# Patient Record
Sex: Female | Born: 1935 | Race: Black or African American | Hispanic: No | Smoking: Former smoker
Health system: Southern US, Community
[De-identification: ages and names within clinical notes are randomized; demographics above are authoritative.]

## PROBLEM LIST (undated history)

## (undated) DIAGNOSIS — F419 Anxiety disorder, unspecified: Secondary | ICD-10-CM

## (undated) DIAGNOSIS — C801 Malignant (primary) neoplasm, unspecified: Secondary | ICD-10-CM

## (undated) DIAGNOSIS — E7800 Pure hypercholesterolemia, unspecified: Secondary | ICD-10-CM

## (undated) DIAGNOSIS — I10 Essential (primary) hypertension: Secondary | ICD-10-CM

## (undated) DIAGNOSIS — K219 Gastro-esophageal reflux disease without esophagitis: Secondary | ICD-10-CM

## (undated) HISTORY — PX: ABDOMINAL HYSTERECTOMY: SHX81

## (undated) HISTORY — PX: OTHER SURGICAL HISTORY: SHX169

---

## 2000-08-25 ENCOUNTER — Emergency Department (HOSPITAL_COMMUNITY): Admission: EM | Admit: 2000-08-25 | Discharge: 2000-08-25 | Payer: Self-pay | Admitting: *Deleted

## 2000-08-25 ENCOUNTER — Encounter: Payer: Self-pay | Admitting: *Deleted

## 2000-09-24 ENCOUNTER — Encounter (HOSPITAL_COMMUNITY): Admission: RE | Admit: 2000-09-24 | Discharge: 2000-10-24 | Payer: Self-pay | Admitting: Internal Medicine

## 2005-04-16 ENCOUNTER — Emergency Department (HOSPITAL_COMMUNITY): Admission: EM | Admit: 2005-04-16 | Discharge: 2005-04-16 | Payer: Self-pay | Admitting: Emergency Medicine

## 2006-03-18 ENCOUNTER — Emergency Department (HOSPITAL_COMMUNITY): Admission: EM | Admit: 2006-03-18 | Discharge: 2006-03-18 | Payer: Self-pay | Admitting: Emergency Medicine

## 2010-03-13 ENCOUNTER — Ambulatory Visit: Payer: Self-pay | Admitting: Orthopedic Surgery

## 2010-03-13 DIAGNOSIS — M7580 Other shoulder lesions, unspecified shoulder: Secondary | ICD-10-CM

## 2010-06-11 NOTE — Letter (Signed)
Summary: History Form  History Form   Imported By: Jacklynn Ganong 03/20/2010 16:11:42  _____________________________________________________________________  External Attachment:    Type:   Image     Comment:   External Document

## 2010-06-11 NOTE — Letter (Signed)
Summary: *Consult Note  [facility]  [address]  [place], [place] [address]   Phone: [phone]  Fax: [phone]    Re:    Kiara Martinez DOB:    [birth year]   Dear: Earvin Hansen   Thank you for requesting that we see the above patient for consultation.  A copy of the detailed office note will be sent under separate cover, for your review.  Evaluation today is consistent with:  1)  IMPINGEMENT SYNDROME (ICD-726.2) 2)  HX, FAMILY, ASTHMA (ICD-V17.5) 3)  FAMILY HISTORY OF ARTHRITIS (ICD-V17.7) 4)  FAMILY HISTORY OF DIABETES (ICD-V18.0)   Our recommendation is for: subacromial injection   New Orders include:  1)  New Patient Level III [99203] 2)  Joint Aspirate / Injection, Large [20610] 3)  Depo- Medrol 40mg  [J1030]   New Medications started today include: none   After today's visit, the patients current medications include:the same   Thank you for this consultation.  If you have any further questions regarding the care of this patient, please do not hesitate to contact me @ [phone]  Thank you for this opportunity to look after your patient.  Sincerely,   Fuller Canada MD

## 2010-06-11 NOTE — Assessment & Plan Note (Signed)
Summary: EVAL/TREAT RT SHOULDER PAIN,RADIATES DOWNWARD/STATES DOES NOT...   Vital Signs:  Patient profile:   77 year old female Height:      56 inches Weight:      206 pounds Pulse rate:   70 / minute Resp:     16 per minute  Vitals Entered By: Fuller Canada MD (March 13, 2010 2:00 PM)  Visit Type:  new patient Referring Provider:  Dr. Loleta Chance Primary Provider:  Dr. Mirna Mires  CC:  right shoulder pain.  History of Present Illness: I saw Kiara Martinez in the office today for an initial visit.  She is a 77 years old woman with the complaint of:  right shoulder pain.  No injury.  Tylenol 3 and Voltaren gel for shoulder pain, Insulin, cholesterol med and BP med.  Xrays today in our office.  This is a 77 year old female presents with pain in her RIGHT shoulder radiating towards her neck and down into her arm about above the elbow.  No associated trauma.  Complains of increased pain at night, not as bad during the day  Her pain is 7/10 she described as throbbing it came on gradually she denies any loss of motion      Allergies (verified): No Known Drug Allergies  Past History:  Past Medical History: diabetic htn  Past Surgical History: cancer ovaries  Family History: Family History of Diabetes Family History of Arthritis Hx, family, asthma  Social History: Patient is divorced.  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 Cardiovascular:  Denies chest pain, palpitations, fainting, and murmurs. Respiratory:  Denies short of breath, wheezing, couch, tightness, pain on inspiration, and snoring . Gastrointestinal:  Complains of heartburn; denies nausea, vomiting, diarrhea, constipation, and blood in your stools. Genitourinary:  Denies frequency, urgency, difficulty urinating, painful urination, flank pain, and bleeding in  urine. Neurologic:  Denies numbness, tingling, unsteady gait, dizziness, tremors, and seizure. Musculoskeletal:  Denies joint pain, swelling, instability, stiffness, redness, heat, and muscle pain. Endocrine:  Denies excessive thirst, exessive urination, and heat or cold intolerance. Psychiatric:  Denies nervousness, depression, anxiety, and hallucinations. Skin:  Denies changes in the skin, poor healing, rash, itching, and redness. HEENT:  Complains of watering; denies blurred or double vision, eye pain, and redness. Immunology:  Complains of seasonal allergies; denies sinus problems and allergic to bee stings. Hemoatologic:  Denies easy bleeding and brusing.  Physical Exam  Skin:  intact without lesions or rashes, multiple pigmentations but no evidence of mild Cervical Nodes:  no significant adenopathy Psych:  alert and cooperative; normal mood and affect; normal attention span and concentration   Shoulder/Elbow Exam  General:    Well-developed, well-nourished, Large body habitus; no deformities, normal grooming.    Inspection:    Inspection is normal.    Palpation:    tenderness R-AC:, tenderness R-deltoid:, and tenderness R-biciptal tendon:.    Vascular:    Radial, ulnar, brachial, and axillary pulses 2+ and symmetric; capillary refill less than 2 seconds; no evidence of ischemia, clubbing, or cyanosis.    Sensory:    Gross sensation intact in the upper extremities.    Motor:    Normal strength in the upper extremities.    Reflexes:    Normal reflexes in the upper extremities.    Shoulder Exam:    Right:    Inspection:  Normal    Palpation:  Normal  Stability:  stable    Tenderness:  no    Swelling:  no    Erythema:  no    Range of Motion:       Flexion-Active: 180       Extension-Active: 45       Flexion-Passive: 180       Extension-Passive: 45       External Rotation : 45       Interior Rotation : T7    Left:    Inspection:  Normal    Palpation:   Normal    Stability:  stable    Tenderness:  no    Swelling:  no    Erythema:  no    Range of Motion:       Flexion-Active: 180       Extension-Active: 45       Flexion-Passive: 180       Extension-Passive: 45       External Rotation : 45       Interior Rotation : T7  Impingement Sign NEER:    Right positive; Left negative Impingement Sign HAWKINS:    Right negative; Left negative Apprehension Sign:    Right negative; Left negative AC joint Adduction Test:    Right negative; Left negative   Impression & Recommendations:  Problem # 1:  IMPINGEMENT SYNDROME (ICD-726.2) Assessment New  she absolutely refuses an x-ray but we did diagnose her clinically with subacromial impingement and gave her an injection.  Orders: New Patient Level III (06269) Joint Aspirate / Injection, Large (20610) Depo- Medrol 40mg  (J1030)  Patient Instructions: 1)  You have received an injection of cortisone today. You may experience increased pain at the injection site. Apply ice pack to the area for 20 minutes every 2 hours and take 2 xtra strength tylenol every 8 hours. This increased pain will usually resolve in 24 hours. The injection will take effect in 3-10 days.  2)  Please schedule a follow-up appointment as needed.   Orders Added: 1)  New Patient Level III [48546] 2)  Joint Aspirate / Injection, Large [20610] 3)  Depo- Medrol 40mg  [J1030]

## 2011-11-06 ENCOUNTER — Encounter (HOSPITAL_COMMUNITY): Payer: Self-pay | Admitting: Emergency Medicine

## 2011-11-06 ENCOUNTER — Emergency Department (HOSPITAL_COMMUNITY)
Admission: EM | Admit: 2011-11-06 | Discharge: 2011-11-06 | Disposition: A | Discharge status: Home / Self Care | Payer: Medicare Other | Attending: Emergency Medicine | Admitting: Emergency Medicine

## 2011-11-06 DIAGNOSIS — R42 Dizziness and giddiness: Secondary | ICD-10-CM | POA: Insufficient documentation

## 2011-11-06 HISTORY — DX: Essential (primary) hypertension: I10

## 2011-11-06 LAB — POCT I-STAT, CHEM 8
Creatinine, Ser: 0.9 mg/dL (ref 0.50–1.10)
Hemoglobin: 14.3 g/dL (ref 12.0–15.0)
Sodium: 139 mEq/L (ref 135–145)
TCO2: 24 mmol/L (ref 0–100)

## 2011-11-06 MED ORDER — MECLIZINE HCL 25 MG PO TABS: 25.0000 mg | ORAL_TABLET | Freq: Three times a day (TID) | ORAL | Status: AC | PRN

## 2011-11-06 MED ORDER — MECLIZINE HCL 12.5 MG PO TABS
25.0000 mg | ORAL_TABLET | Freq: Once | ORAL | Status: AC
  Administered 2011-11-06: 25 mg via ORAL
  Filled 2011-11-06: qty 2

## 2011-11-06 NOTE — ED Notes (Signed)
Pt ambulating fine. Denies dizziness at this time.

## 2011-11-06 NOTE — ED Notes (Addendum)
Reports that she got up this am and was very dizzy.Family here with patient.  Talking and does not appear to be in any distress.

## 2011-11-06 NOTE — ED Provider Notes (Signed)
History     CSN: [id number]  Arrival date & time      First MD Initiated Contact with Patient 11/06/11 0612      Chief Complaint  Patient presents with  . Dizziness    (Consider location/radiation/quality/duration/timing/severity/associated sxs/prior treatment) HPI  Kiara Martinez is a 78 y.o. female brought in by ambulance, who presents to the Emergency Department complaining of dizziness when she got out of bed this morning to go to the bathroom.She had been up twice earlier without dizziness.  Associated with mild nausea that resolved on its own. She took her blood pressure medicines. She denies fever, chills, nausea, vomiting, chest pain, shortness of breath.   PCP Dr. Loleta Chance No past medical history on file.  No past surgical history on file.  No family history on file.  History  Substance Use Topics  . Smoking status: Not on file  . Smokeless tobacco: Not on file  . Alcohol Use: Not on file    OB History    No data available      Review of Systems  Constitutional: Negative for fever.       10 Systems reviewed and are negative for acute change except as noted in the HPI.  HENT: Negative for congestion.   Eyes: Negative for discharge and redness.  Respiratory: Negative for cough and shortness of breath.   Cardiovascular: Negative for chest pain.  Gastrointestinal: Negative for vomiting and abdominal pain.  Musculoskeletal: Negative for back pain.  Skin: Negative for rash.  Neurological: Positive for dizziness. Negative for syncope, numbness and headaches.  Psychiatric/Behavioral:       No behavior change.    Allergies  Review of patient's allergies indicates not on file.  Home Medications  No current outpatient prescriptions on file.  There were no vitals taken for this visit.  Physical Exam  Nursing note and vitals reviewed. Constitutional: She is oriented to person, place, and time. She appears well-developed and well-nourished.       Awake, alert,  nontoxic appearance.  HENT:  Head: Atraumatic.  Eyes: Right eye exhibits no discharge. Left eye exhibits no discharge.  Neck: Neck supple.  Cardiovascular: Normal rate, normal heart sounds and intact distal pulses.   Pulmonary/Chest: Effort normal and breath sounds normal. She exhibits no tenderness.  Abdominal: Soft. Bowel sounds are normal. There is no tenderness. There is no rebound.  Musculoskeletal: She exhibits no edema and no tenderness.       Baseline ROM, no obvious new focal weakness.  Neurological: She is alert and oriented to person, place, and time. She has normal reflexes. No cranial nerve deficit.       Mental status and motor strength appears baseline for patient and situation.  Skin: No rash noted.  Psychiatric: She has a normal mood and affect.    ED Course  Procedures (including critical care time)    MDM  Patient with sudden onset of dizziness this morning that has improved since arrival in the ER. Orthostatics negative. Given antivert. Pt stable in ED with no significant deterioration in condition.The patient appears reasonably screened and/or stabilized for discharge and I doubt any other medical condition or other [facility] requiring further screening, evaluation, or treatment in the ED at this time prior to discharge.  MDM Reviewed: nursing note and vitals           Nicoletta Dress. Colon Branch, MD 11/06/11 4098

## 2011-11-06 NOTE — Discharge Instructions (Signed)
Make position changes slowly. Take the medicine as directed. Follow up with Dr. Loleta Chance.

## 2011-11-06 NOTE — ED Notes (Signed)
EMS called out - patient difficulty standing due to feeling dizzy.  BP 210/98 Pulse 65  02 sat 94% room air

## 2011-11-23 ENCOUNTER — Encounter (HOSPITAL_COMMUNITY): Payer: Self-pay

## 2011-11-23 ENCOUNTER — Emergency Department (HOSPITAL_COMMUNITY)
Admission: EM | Admit: 2011-11-23 | Discharge: 2011-11-23 | Disposition: A | Discharge status: Home / Self Care | Payer: Medicare Other | Attending: Emergency Medicine | Admitting: Emergency Medicine

## 2011-11-23 DIAGNOSIS — I10 Essential (primary) hypertension: Secondary | ICD-10-CM | POA: Insufficient documentation

## 2011-11-23 DIAGNOSIS — S30860A Insect bite (nonvenomous) of lower back and pelvis, initial encounter: Secondary | ICD-10-CM | POA: Insufficient documentation

## 2011-11-23 DIAGNOSIS — E119 Type 2 diabetes mellitus without complications: Secondary | ICD-10-CM | POA: Insufficient documentation

## 2011-11-23 DIAGNOSIS — W57XXXA Bitten or stung by nonvenomous insect and other nonvenomous arthropods, initial encounter: Secondary | ICD-10-CM | POA: Insufficient documentation

## 2011-11-23 MED ORDER — DOXYCYCLINE HYCLATE 100 MG PO TABS
100.0000 mg | ORAL_TABLET | Freq: Once | ORAL | Status: AC
  Administered 2011-11-23: 100 mg via ORAL
  Filled 2011-11-23: qty 1

## 2011-11-23 MED ORDER — DOXYCYCLINE HYCLATE 100 MG PO CAPS: 100.0000 mg | ORAL_CAPSULE | Freq: Two times a day (BID) | ORAL | Status: AC

## 2011-11-23 NOTE — ED Provider Notes (Signed)
History     CSN: [id number]  Arrival date & time 11/23/11  1357   First MD Initiated Contact with Patient 11/23/11 1427      Chief Complaint  Patient presents with  . Tick Removal    (Consider location/radiation/quality/duration/timing/severity/associated sxs/prior treatment) HPI Comments: Pt found imbedded tick engorged, on L flank area yest.  No fever or rash.  No joint pain.  The history is provided by the patient. No language interpreter was used.    Past Medical History  Diagnosis Date  . Diabetes mellitus   . Hypertension     History reviewed. No pertinent past surgical history.  No family history on file.  History  Substance Use Topics  . Smoking status: Never Smoker   . Smokeless tobacco: Not on file  . Alcohol Use: No    OB History    Grav Para Term Preterm Abortions TAB SAB Ect Mult Living                  Review of Systems  Constitutional: Negative for fever.  Musculoskeletal: Negative for myalgias, joint swelling and arthralgias.  Skin: Negative for rash.       Erythematous area  All other systems reviewed and are negative.    Allergies  Review of patient's allergies indicates no known allergies.  Home Medications   Current Outpatient Rx  Name Route Sig Dispense Refill  . AMLODIPINE BESY-BENAZEPRIL HCL 5-10 MG PO CAPS Oral Take 1 capsule by mouth daily.    . ASPIRIN 81 MG PO TABS Oral Take 81 mg by mouth daily.    . CHOLINE & MAG SALICYLATES 500 MG PO TABS Oral Take 500 mg by mouth 2 (two) times daily.    Marland Kitchen DOXYCYCLINE HYCLATE 100 MG PO CAPS Oral Take 1 capsule (100 mg total) by mouth 2 (two) times daily. 20 capsule 0  . FENOFIBRIC ACID PO Oral Take 45 mg by mouth.    Marland Kitchen HYDROCHLOROTHIAZIDE 12.5 MG PO TABS Oral Take 12.5 mg by mouth daily.    Marland Kitchen OMEPRAZOLE 20 MG PO CPDR Oral Take 20 mg by mouth daily.      BP 124/66  Pulse 68  Temp 98.2 F (36.8 C) (Oral)  Resp 20  Ht 5\' 5"  (1.651 m)  Wt 190 lb (86.183 kg)  BMI 31.62 kg/m2  SpO2  96%  Physical Exam  Nursing note and vitals reviewed. Constitutional: She is oriented to person, place, and time. She appears well-developed and well-nourished. No distress.  HENT:  Head: Normocephalic and atraumatic.  Eyes: EOM are normal.  Neck: Normal range of motion.  Cardiovascular: Normal rate, regular rhythm and normal heart sounds.   Pulmonary/Chest: Effort normal and breath sounds normal.  Abdominal: Soft. She exhibits no distension. There is no tenderness.  Musculoskeletal: Normal range of motion.       Back:  Neurological: She is alert and oriented to person, place, and time.  Skin: Skin is warm and dry.  Psychiatric: She has a normal mood and affect. Judgment normal.    ED Course  Procedures (including critical care time)  Labs Reviewed - No data to display No results found.   1. Tick bite       MDM  rx-doxycycline, 20 F/u with PCP prn        Evalina Field, PA 11/23/11 1441

## 2011-11-23 NOTE — ED Notes (Signed)
Tick to left flank area.

## 2011-11-23 NOTE — ED Provider Notes (Signed)
Medical screening examination/treatment/procedure(s) were performed by non-physician practitioner and as supervising physician I was immediately available for consultation/collaboration.   Juma Oxley L Anusha Claus, MD 11/23/11 1512 

## 2012-09-14 ENCOUNTER — Emergency Department (HOSPITAL_COMMUNITY): Payer: Medicare Other

## 2012-09-14 ENCOUNTER — Encounter (HOSPITAL_COMMUNITY): Payer: Self-pay | Admitting: *Deleted

## 2012-09-14 ENCOUNTER — Emergency Department (HOSPITAL_COMMUNITY)
Admission: EM | Admit: 2012-09-14 | Discharge: 2012-09-14 | Disposition: A | Discharge status: Home / Self Care | Payer: Medicare Other | Attending: Emergency Medicine | Admitting: Emergency Medicine

## 2012-09-14 DIAGNOSIS — R259 Unspecified abnormal involuntary movements: Secondary | ICD-10-CM | POA: Insufficient documentation

## 2012-09-14 DIAGNOSIS — M6281 Muscle weakness (generalized): Secondary | ICD-10-CM

## 2012-09-14 DIAGNOSIS — E876 Hypokalemia: Secondary | ICD-10-CM | POA: Insufficient documentation

## 2012-09-14 DIAGNOSIS — Z8543 Personal history of malignant neoplasm of ovary: Secondary | ICD-10-CM | POA: Insufficient documentation

## 2012-09-14 DIAGNOSIS — Z79899 Other long term (current) drug therapy: Secondary | ICD-10-CM | POA: Insufficient documentation

## 2012-09-14 DIAGNOSIS — E119 Type 2 diabetes mellitus without complications: Secondary | ICD-10-CM | POA: Insufficient documentation

## 2012-09-14 DIAGNOSIS — Z7982 Long term (current) use of aspirin: Secondary | ICD-10-CM | POA: Insufficient documentation

## 2012-09-14 DIAGNOSIS — I10 Essential (primary) hypertension: Secondary | ICD-10-CM | POA: Insufficient documentation

## 2012-09-14 HISTORY — DX: Malignant (primary) neoplasm, unspecified: C80.1

## 2012-09-14 LAB — BASIC METABOLIC PANEL
CO2: 29 mEq/L (ref 19–32)
Calcium: 9.8 mg/dL (ref 8.4–10.5)
GFR calc Af Amer: 73 mL/min — ABNORMAL LOW (ref >90)
GFR calc non Af Amer: 63 mL/min — ABNORMAL LOW (ref >90)
Sodium: 139 mEq/L (ref 135–145)

## 2012-09-14 LAB — CBC
MCH: 29.6 pg (ref 26.0–34.0)
Platelets: 329 10*3/uL (ref 150–400)
RBC: 4.63 MIL/uL (ref 3.87–5.11)

## 2012-09-14 LAB — TROPONIN I: Troponin I: <0.3 ng/mL (ref <0.30)

## 2012-09-14 LAB — APTT: aPTT: 29 seconds (ref 24–37)

## 2012-09-14 LAB — PROTIME-INR: INR: 0.96 (ref 0.00–1.49)

## 2012-09-14 MED ORDER — POTASSIUM CHLORIDE ER 20 MEQ PO TBCR: 20.0000 meq | EXTENDED_RELEASE_TABLET | Freq: Two times a day (BID) | ORAL | Status: DC

## 2012-09-14 MED ORDER — SODIUM CHLORIDE 0.9 % IV SOLN
INTRAVENOUS | Status: DC
  Administered 2012-09-14: 14:00:00 via INTRAVENOUS

## 2012-09-14 NOTE — ED Notes (Addendum)
Attempted to obtain second IV site. Not successful, EDP aware and reports one IV access site is okay at this time. No new orders given at this time.

## 2012-09-14 NOTE — ED Provider Notes (Signed)
History  This chart was scribed for Ward Givens, MD by Bennett Scrape, ED Scribe. This patient was seen in room APA08/APA08 and the patient's care was started at 12:45 PM.  CSN: [id number]  Arrival date & time 09/14/12  1224   First MD Initiated Contact with Patient 09/14/12 1243      Chief Complaint  Patient presents with  . Numbness     The history is provided by the patient. No language interpreter was used.    HPI Comments: Kiara Martinez is a 79 y.o. female who presents to the Emergency Department complaining of one hour of intermittent numbness in the upper right arm that started shortly after trimming shrubs with manualclippers. She states that she was manually trimming the bushes which were pretty high over her head for over one hour when the numbness started. She reports mild difficulty holding a glass because her hand was shaking but denies having problems dressing herself or opening a door. She reports numbness in the right toes but states that this is an ongoing intermittent problem. She denies having a h/o CVA or TIA. She denies nausea, emesis, HA, speech difficulty, change in vision,abdominal pain, Chest pain, diaphoresis,  and SOB as associated symptoms.  She has a h/o DM (CBG was 113 when she checked it at home while having the numbness), HTN and ovarian CA with a hysterectomy. Pt denies smoking and alcohol use.  Pt is right-handed  PCP is Dr. Mirna Mires  Past Medical History  Diagnosis Date  . Diabetes mellitus   . Hypertension   . Cancer     Past Surgical History  Procedure Laterality Date  . Abdominal hysterectomy    . Surgery for ovarian cancer      History reviewed. No pertinent family history.  History  Substance Use Topics  . Smoking status: Never Smoker   . Smokeless tobacco: Not on file  . Alcohol Use: No  lives with son  No OB history provided.  Review of Systems  Respiratory: Negative for shortness of breath.   Cardiovascular: Negative  for chest pain.  Gastrointestinal: Negative for nausea, vomiting and abdominal pain.  Neurological: Positive for numbness. Negative for speech difficulty, weakness and headaches.  All other systems reviewed and are negative.    Allergies  Review of patient's allergies indicates no known allergies.  Home Medications   Current Outpatient Rx  Name  Route  Sig  Dispense  Refill  . amLODipine-benazepril (LOTREL) 5-10 MG per capsule   Oral   Take 1 capsule by mouth daily.         Marland Kitchen aspirin 81 MG tablet   Oral   Take 81 mg by mouth daily.         . choline-magnesium salicylate (TRILISATE) 500 MG tablet   Oral   Take 500 mg by mouth 2 (two) times daily.         . FENOFIBRIC ACID PO   Oral   Take 45 mg by mouth.         . hydrochlorothiazide (HYDRODIURIL) 12.5 MG tablet   Oral   Take 12.5 mg by mouth daily.         Marland Kitchen omeprazole (PRILOSEC) 20 MG capsule   Oral   Take 20 mg by mouth daily.           Triage Vitals: BP 170/82  Pulse 62  Temp(Src) 98.5 F (36.9 C) (Oral)  Resp 18  Ht 5\' 5"  (1.651 m)  Wt 190 lb (  86.183 kg)  BMI 31.62 kg/m2  SpO2 96%  Vital signs normal except hypertension    Physical Exam  Nursing note and vitals reviewed. Constitutional: She is oriented to person, place, and time. She appears well-developed and well-nourished.  Non-toxic appearance. She does not appear ill. No distress.  HENT:  Head: Normocephalic and atraumatic.  Right Ear: External ear normal.  Left Ear: External ear normal.  Nose: Nose normal. No mucosal edema or rhinorrhea.  Mouth/Throat: Oropharynx is clear and moist and mucous membranes are normal. No dental abscesses or edematous.  Eyes: Conjunctivae and EOM are normal. Pupils are equal, round, and reactive to light.  Neck: Normal range of motion and full passive range of motion without pain. Neck supple.  Cardiovascular: Normal rate and regular rhythm.  Exam reveals no gallop and no friction rub.   ? intermittant  systolic murmer heard   Pulmonary/Chest: Effort normal and breath sounds normal. No respiratory distress. She has no wheezes. She has no rhonchi. She has no rales. She exhibits no tenderness and no crepitus.  Abdominal: Soft. Normal appearance and bowel sounds are normal. She exhibits no distension. There is no tenderness. There is no rebound and no guarding.  Musculoskeletal: Normal range of motion. She exhibits no edema and no tenderness.       Arms: Moves all extremities well. Indicates her numbness in in her upper arm  Neurological: She is alert and oriented to person, place, and time. She has normal strength. No cranial nerve deficit.  Equal grip strengths, normal strength in bilateral lower extremities, normal heel to shin bilaterally, no pronator drift  Skin: Skin is warm, dry and intact. No rash noted. No erythema. No pallor.  Psychiatric: She has a normal mood and affect. Her speech is normal and behavior is normal. Her mood appears not anxious.    ED Course  Procedures (including critical care time)  Medications  0.9 %  sodium chloride infusion ( Intravenous New Bag/Given 09/14/12 1409)    DIAGNOSTIC STUDIES: Oxygen Saturation is 96% on room air, normal by my interpretation.    COORDINATION OF CARE: 12:56 PM-Discussed treatment plan which includes MRI with pt at bedside and pt agreed to plan.   Recheck at discharge, states her arm feels back to normal.   Results for orders placed during the hospital encounter of 09/14/12  CBC      Result Value Range   WBC 9.1  4.0 - 10.5 K/uL   RBC 4.63  3.87 - 5.11 MIL/uL   Hemoglobin 13.7  12.0 - 15.0 g/dL   HCT 16.1  09.6 - 04.5 %   MCV 87.3  78.0 - 100.0 fL   MCH 29.6  26.0 - 34.0 pg   MCHC 33.9  30.0 - 36.0 g/dL   RDW 40.9  81.1 - 91.4 %   Platelets 329  150 - 400 K/uL  BASIC METABOLIC PANEL      Result Value Range   Sodium 139  135 - 145 mEq/L   Potassium 3.3 (*) 3.5 - 5.1 mEq/L   Chloride 101  96 - 112 mEq/L   CO2 29  19 -  32 mEq/L   Glucose, Bld 85  70 - 99 mg/dL   BUN 14  6 - 23 mg/dL   Creatinine, Ser 7.82  0.50 - 1.10 mg/dL   Calcium 9.8  8.4 - 95.6 mg/dL   GFR calc non Af Amer 63 (*) >90 mL/min   GFR calc Af Amer 73 (*) >90 mL/min  TROPONIN I      Result Value Range   Troponin I <0.30  <0.30 ng/mL  PROTIME-INR      Result Value Range   Prothrombin Time 12.7  11.6 - 15.2 seconds   INR 0.96  0.00 - 1.49  APTT      Result Value Range   aPTT 29  24 - 37 seconds   Laboratory interpretation all normal except hypokalemia  Mr Brain Wo Contrast  09/14/2012  *RADIOLOGY REPORT*  Clinical Data: Hour long episode of right-sided weakness and numbness that has now resolved.  MRI HEAD WITHOUT CONTRAST  Technique:  Multiplanar, multiecho pulse sequences of the brain and surrounding structures were obtained according to standard protocol without intravenous contrast.  Comparison: CT head 04/16/2005.  Findings: There is no evidence for acute infarction, intracranial hemorrhage, mass lesion, hydrocephalus, or extra-axial fluid. There is mild age related atrophy.  Mild chronic microvascular ischemic change affects the periventricular greater than subcortical white matter.  Moderate hyperostosis of the calvarium without focal osseous lesion.  Flow voids are maintained in the internal carotid arteries, basilar artery, and both vertebral arteries.   No pineal lesion.  Within the sella turcica posteriorly there is a 8 x 6 x 9 mm lesion with prolonged T1 and T2 signal not clearly fat containing.  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  Features are not suggestive of craniopharyngioma or Rathke's cleft cyst.  There may be an associated empty sella.  The cerebellar tonsils are abnormally shaped, and lie  just a few millimeters below the foramen magnum.  The findings are most consistent with tonsillar ectopia, although their abnormal shape as seen on image 11 series 2, and image 4 series 5 has some features suggestive of Chiari  I malformation. Correlate clinically.  The paranasal sinuses are clear.  The orbits are unremarkable.  No mastoid fluid.  IMPRESSION: No acute stroke or hemorrhage.  No mass lesion or hydrocephalus. Age related atrophy and chronic microvascular ischemic change.  Abnormally shaped cerebellar tonsils without significant descent below the foramen magnum.  Findings most consistent with tonsillar ectopia although correlate clinically for cerebellar or cervicomedullary dysfunction.  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  No evidence for acute pituitary lesion.   Original Report Authenticated By: Davonna Belling, M.D.       Date: 09/14/2012  Rate: 61  Rhythm: normal sinus rhythm and premature atrial contractions (PAC)  QRS Axis: normal  Intervals: normal  ST/T Wave abnormalities: nonspecific ST/T changes  Conduction Disutrbances:LVH  Narrative Interpretation:   Old EKG Reviewed: none available     1. Muscle weakness   2. Hypokalemia       MDM  Pt had atypical numbness only in her upper right arm and in her toes, with normal MRI most likely from trimming her bushes today.    I personally performed the services described in this documentation, which was scribed in my presence. The recorded information has been reviewed and considered.  Devoria Albe, MD, Armando Gang    Ward Givens, MD 09/14/12 [phone]

## 2012-09-14 NOTE — ED Notes (Signed)
Onset of numb sensation in rt arm and rt toes. Onset 30 min pta when trimming shrub .alert. Oriented.

## 2014-01-05 ENCOUNTER — Encounter (HOSPITAL_COMMUNITY): Payer: Self-pay | Admitting: Emergency Medicine

## 2014-01-05 ENCOUNTER — Emergency Department (HOSPITAL_COMMUNITY)
Admission: EM | Admit: 2014-01-05 | Discharge: 2014-01-05 | Disposition: A | Discharge status: Home / Self Care | Payer: PRIVATE HEALTH INSURANCE | Attending: Emergency Medicine | Admitting: Emergency Medicine

## 2014-01-05 ENCOUNTER — Emergency Department (HOSPITAL_COMMUNITY): Payer: PRIVATE HEALTH INSURANCE

## 2014-01-05 DIAGNOSIS — I10 Essential (primary) hypertension: Secondary | ICD-10-CM | POA: Insufficient documentation

## 2014-01-05 DIAGNOSIS — R202 Paresthesia of skin: Secondary | ICD-10-CM

## 2014-01-05 DIAGNOSIS — Z7982 Long term (current) use of aspirin: Secondary | ICD-10-CM | POA: Diagnosis not present

## 2014-01-05 DIAGNOSIS — R209 Unspecified disturbances of skin sensation: Secondary | ICD-10-CM | POA: Diagnosis not present

## 2014-01-05 DIAGNOSIS — Z79899 Other long term (current) drug therapy: Secondary | ICD-10-CM | POA: Diagnosis not present

## 2014-01-05 DIAGNOSIS — Z8543 Personal history of malignant neoplasm of ovary: Secondary | ICD-10-CM | POA: Diagnosis not present

## 2014-01-05 DIAGNOSIS — E119 Type 2 diabetes mellitus without complications: Secondary | ICD-10-CM | POA: Diagnosis not present

## 2014-01-05 DIAGNOSIS — E7800 Pure hypercholesterolemia, unspecified: Secondary | ICD-10-CM | POA: Diagnosis not present

## 2014-01-05 HISTORY — DX: Pure hypercholesterolemia, unspecified: E78.00

## 2014-01-05 LAB — CBG MONITORING, ED: Glucose-Capillary: 128 mg/dL — ABNORMAL HIGH (ref 70–99)

## 2014-01-05 MED ORDER — LORAZEPAM 1 MG PO TABS
1.0000 mg | ORAL_TABLET | Freq: Once | ORAL | Status: AC
  Administered 2014-01-05: 1 mg via ORAL
  Filled 2014-01-05: qty 1

## 2014-01-05 MED ORDER — CLONIDINE HCL 0.1 MG PO TABS: ORAL_TABLET | ORAL | Status: DC

## 2014-01-05 NOTE — Discharge Instructions (Signed)
Your MRI did not show evidence of a stroke. Continue your blood pressure medications. You need to write down your blood pressures and keep a log for your doctors. Use the clonidine for a BP over 200. Continue your daily aspirin. Look at the hypertension information sheet.

## 2014-01-05 NOTE — ED Notes (Signed)
Pt states has been checking blood pressure at home and was 198/65. States started yesterday. States did get dizzy once yesterday but went away quickly. Denies pain. Pt is alert/oriented. "not dizzy but like lightheaded this morning". Pt checked sugar and was fine. Denies any new meds. Denies chest pressure/tightness/pain or sob.

## 2014-01-05 NOTE — ED Notes (Signed)
Family member Iyanla Eilers [phone]- call when pt is ready for d/c.

## 2014-01-05 NOTE — ED Notes (Signed)
C/o of no pain, c/o of feeling dizzy.

## 2014-01-05 NOTE — ED Provider Notes (Signed)
CSN: [id number]     Arrival date & time 01/05/14  1038 History   This chart was scribed for Janice Norrie, MD, by Neta Ehlers, ED Scribe. This patient was seen in room APA12/APA12 and the patient's care was started at 11:24 AM.  'First MD Initiated Contact with Patient 01/05/14 1101     Chief Complaint  Patient presents with  . Hypertension    HPI HPI Comments: Kiara Martinez is a 80 y.o. female, with a h/o DM and HTN, who presents to the Emergency Department complaining of elevated BP which she first noticed yesterday afternoon at 5 pm. Kiara Martinez reports she measured her BP because she was experiencing dizziness. She continued to measure her BP throughout the evening as well as this morning; she reports when she measured her BP this morning it was approximately 200/98. Kiara Martinez called her doctor at 1 am and then she could not make an appointment with her PCP, Dr. Berdine Addison, this morning, and she came to the ED for treatment. Pt states yesterday was the first time she had used her BP cuff and taken her BP.   She reports  a typical BP systolic is 546. She reports she recently purchased a blood pressure cuff and used it yesterday for the first time. She also reports that her current blood pressure medications are "doing me no good." She took her BP medication 9 hours ago because it was over 568 systolic.   Kiara Martinez also endorses numbness and paresthesia to her right arm yesterday evening and her right leg this morning when her blood pressure was elevated. She also endorses a "funny feeling" to her right forearm proximal to her elbow. She reports the numbness to her leg resolved spontaneously after approximately 15 minutes at 7 am. She is unsure if the numbness affected her ambulation as she did not stand up during the episode. However, she is currently able to ambulate normally and she is able to use her right arm normally. She denies numbness to her face.   She denies a headache, instead  stating her head feels "like there is pressure" in it, stating her head "feels heavy". She also denies chest pain, SOB, or lower extremity swelling. She denies increased salt consumption. She denies a h/o strokes.   She reports her glucose levels have been in the 100's; she takes insulin. She questions if she should take insulin when her levels are in the 100's.   She denies smoking cigarettes or drinking alcohol.  Kiara Martinez resides with her son.   PCP Dr Berdine Addison  Past Medical History  Diagnosis Date  . Diabetes mellitus   . Hypertension   . Cancer   . Hypercholesteremia    Past Surgical History  Procedure Laterality Date  . Abdominal hysterectomy    . Surgery for ovarian cancer     History reviewed. No pertinent family history. History  Substance Use Topics  . Smoking status: Never Smoker   . Smokeless tobacco: Not on file  . Alcohol Use: No   Lives with her son   No OB history provided.   Review of Systems  Respiratory: Negative for shortness of breath.   Cardiovascular: Negative for chest pain and leg swelling.  Gastrointestinal: Negative for vomiting.  Neurological: Positive for dizziness and numbness.    Allergies  Review of patient's allergies indicates no known allergies.  Home Medications   Prior to Admission medications   Medication Sig Start Date End Date Taking? Authorizing Provider  acetaminophen (TYLENOL) 650 MG CR tablet Take 650 mg by mouth every 8 (eight) hours as needed for pain.    Historical Provider, MD  amLODipine-benazepril (LOTREL) 5-10 MG per capsule Take 1 capsule by mouth daily.    Historical Provider, MD  aspirin 325 MG tablet Take 325 mg by mouth daily.    Historical Provider, MD  Choline Fenofibrate (FENOFIBRIC ACID) 45 MG CPDR Take 45 mg by mouth daily.    Historical Provider, MD  hydrochlorothiazide (HYDRODIURIL) 25 MG tablet Take 25 mg by mouth daily.    Historical Provider, MD  omeprazole (PRILOSEC) 20 MG capsule Take 20 mg by  mouth daily.    Historical Provider, MD  potassium chloride 20 MEQ TBCR Take 20 mEq by mouth 2 (two) times daily. 09/14/12   Janice Norrie, MD   Triage Vitals: BP 186/75  Pulse 65  Temp(Src) 97.5 F (36.4 C) (Oral)  Resp 19  SpO2 97%  Vital signs normal except hypertension   Physical Exam  Nursing note and vitals reviewed. Constitutional: She is oriented to person, place, and time. She appears well-developed and well-nourished.  Non-toxic appearance. She does not appear ill. No distress.  HENT:  Head: Normocephalic and atraumatic.  Right Ear: External ear normal.  Left Ear: External ear normal.  Nose: Nose normal. No mucosal edema or rhinorrhea.  Mouth/Throat: Oropharynx is clear and moist and mucous membranes are normal. No dental abscesses or uvula swelling.  Eyes: Conjunctivae and EOM are normal. Pupils are equal, round, and reactive to light.  Neck: Normal range of motion and full passive range of motion without pain. Neck supple.  Cardiovascular: Normal rate, regular rhythm and normal heart sounds.  Exam reveals no gallop and no friction rub.   No murmur heard. No bruits.   Pulmonary/Chest: Effort normal and breath sounds normal. No respiratory distress. She has no wheezes. She has no rhonchi. She has no rales. She exhibits no tenderness and no crepitus.  Abdominal: Soft. Normal appearance and bowel sounds are normal. She exhibits no distension. There is no tenderness. There is no rebound and no guarding.  Musculoskeletal: Normal range of motion. She exhibits no edema and no tenderness.  Moves all extremities well.   Neurological: She is alert and oriented to person, place, and time. She has normal strength. No cranial nerve deficit.  Mild pronator drift on right. Finger to nose coordinated. Heel to shin coordinated. Grips equal.   Skin: Skin is warm, dry and intact. No rash noted. No erythema. No pallor.  Psychiatric: She has a normal mood and affect. Her speech is normal and  behavior is normal. Her mood appears not anxious.    ED Course  Procedures (including critical care time)  Medications  LORazepam (ATIVAN) tablet 1 mg (1 mg Oral Given 01/05/14 1155)     DIAGNOSTIC STUDIES: Oxygen Saturation is 100% on room air, normal by my interpretation.    COORDINATION OF CARE:  11:40 AM- Discussed treatment plan with patient, and the patient agreed to the plan. The plan includes medication and imaging.   15:20 BP 125/99 only with ativan   Pt given the results of her MRI. She will be given clonidine to use PRN for her elevated BP.    Labs Review Labs Reviewed  CBG MONITORING, ED - Abnormal; Notable for the following:    Glucose-Capillary 128 (*)    All other components within normal limits  CBG MONITORING, ED    Imaging Review Mr Brain Wo Contrast  01/05/2014  CLINICAL DATA:  80 year old female with dizziness altered mental status hypertension and numbness on the right side. Initial encounter.  EXAM: MRI HEAD WITHOUT CONTRAST  TECHNIQUE: Multiplanar, multiecho pulse sequences of the brain and surrounding structures were obtained without intravenous contrast.  COMPARISON:  Brain MRI 09/14/2012.  Head CT 04/16/2005.  FINDINGS: Stable cerebral volume. Major intracranial vascular flow voids are stable. No restricted diffusion to suggest acute infarction. No midline shift, mass effect, evidence of mass lesion, ventriculomegaly, extra-axial collection or acute intracranial hemorrhage. Cervicomedullary junction and pituitary are within normal limits. Grossly stable visualized cervical spine.  Stable minimal to mild for age nonspecific cerebral white matter T2 and FLAIR hyperintensity. No cortical encephalomalacia identified. Deep gray matter nuclei, brainstem and cerebellum remain within normal limits for age.  Visible internal auditory structures appear normal. Trace mastoid fluid is unchanged. Visualized orbit soft tissues are within normal limits. Stable paranasal  sinuses. Hyperostosis of the calvarium. Normal bone marrow signal. Stable mild prominence of the midline adenoids since 2006. Visualized scalp soft tissues are within normal limits.  IMPRESSION: No acute intracranial abnormality.   Electronically Signed   By: Lars Pinks M.D.   On: 01/05/2014 15:13     EKG Interpretation None      MDM   Final diagnoses:  Essential hypertension  Intermittent tingling of right hand and foot     New Prescriptions   CLONIDINE (CATAPRES) 0.1 MG TABLET    Take 1 po prn daily for BP over 200    Plan discharge    I personally performed the services described in this documentation, which was scribed in my presence. The recorded information has been reviewed and considered.  Rolland Porter, MD, FACEP    Janice Norrie, MD 01/05/14 [phone]

## 2014-01-06 ENCOUNTER — Encounter (HOSPITAL_COMMUNITY): Payer: Self-pay | Admitting: Emergency Medicine

## 2014-01-06 ENCOUNTER — Emergency Department (HOSPITAL_COMMUNITY)
Admission: EM | Admit: 2014-01-06 | Discharge: 2014-01-06 | Disposition: A | Discharge status: Home / Self Care | Payer: PRIVATE HEALTH INSURANCE | Attending: Emergency Medicine | Admitting: Emergency Medicine

## 2014-01-06 DIAGNOSIS — I10 Essential (primary) hypertension: Secondary | ICD-10-CM

## 2014-01-06 DIAGNOSIS — E119 Type 2 diabetes mellitus without complications: Secondary | ICD-10-CM | POA: Insufficient documentation

## 2014-01-06 DIAGNOSIS — Z8543 Personal history of malignant neoplasm of ovary: Secondary | ICD-10-CM | POA: Insufficient documentation

## 2014-01-06 DIAGNOSIS — F411 Generalized anxiety disorder: Secondary | ICD-10-CM | POA: Insufficient documentation

## 2014-01-06 DIAGNOSIS — Z79899 Other long term (current) drug therapy: Secondary | ICD-10-CM | POA: Diagnosis not present

## 2014-01-06 DIAGNOSIS — Z7982 Long term (current) use of aspirin: Secondary | ICD-10-CM | POA: Diagnosis not present

## 2014-01-06 MED ORDER — ALPRAZOLAM 0.25 MG PO TABS
0.2500 mg | ORAL_TABLET | Freq: Three times a day (TID) | ORAL | Status: DC | PRN
Start: 2014-01-06 — End: 2015-11-05

## 2014-01-06 NOTE — ED Provider Notes (Signed)
CSN: [id number]     Arrival date & time 01/06/14  1701 History   First MD Initiated Contact with Patient 01/06/14 1848    This chart was scribed for Maudry Diego, MD by Terressa Koyanagi, ED Scribe. This patient was seen in room APA18/APA18 and the patient's care was started at 7:06 PM.  Chief Complaint  Patient presents with  . Hypertension   Patient is a 80 y.o. female presenting with hypertension. The history is provided by the patient.  Hypertension This is a recurrent problem. The current episode started 3 to 5 hours ago. The problem occurs constantly. The problem has not changed since onset.Pertinent negatives include no chest pain, no abdominal pain and no headaches. Nothing aggravates the symptoms. Nothing relieves the symptoms.   PCP: Maggie Font, MD  HPI Comments: Kiara Martinez is a 80 y.o. female, with medical Hx noted below and significant for DM and HTN, who presents to the Emergency Department complaining of elevated BP. Specifically, pt reports she took her BP multiple times today with a new BP machine; and at one point her BP was 205/103. Pt reports that she was seen at the ED for the same yesterday and provided with a Rx for clonodine to be used PRN for elevated BP. Pt reports taking one clonodine today. Pt reports she checks her BP everyday at home.   Pt also complains of increased stress as she is on the verge of losing her home.      Past Medical History  Diagnosis Date  . Diabetes mellitus   . Hypertension   . Cancer   . Hypercholesteremia    Past Surgical History  Procedure Laterality Date  . Abdominal hysterectomy    . Surgery for ovarian cancer     History reviewed. No pertinent family history. History  Substance Use Topics  . Smoking status: Never Smoker   . Smokeless tobacco: Not on file  . Alcohol Use: No   OB History   Grav Para Term Preterm Abortions TAB SAB Ect Mult Living                 Review of Systems  Constitutional: Negative for  appetite change and fatigue.  HENT: Negative for congestion, ear discharge and sinus pressure.   Eyes: Negative for discharge.  Respiratory: Negative for cough.   Cardiovascular: Negative for chest pain.  Gastrointestinal: Negative for abdominal pain and diarrhea.  Genitourinary: Negative for frequency and hematuria.  Musculoskeletal: Negative for back pain.  Skin: Negative for rash.  Neurological: Negative for seizures and headaches.  Psychiatric/Behavioral: Negative for hallucinations. The patient is nervous/anxious.    Allergies  Review of patient's allergies indicates no known allergies.  Home Medications   Prior to Admission medications   Medication Sig Start Date End Date Taking? Authorizing Provider  acetaminophen (TYLENOL) 650 MG CR tablet Take 650 mg by mouth every 8 (eight) hours as needed for pain.    Historical Provider, MD  amLODipine-benazepril (LOTREL) 5-10 MG per capsule Take 1 capsule by mouth daily.    Historical Provider, MD  aspirin EC 81 MG tablet Take 81 mg by mouth daily.    Historical Provider, MD  Choline Fenofibrate (FENOFIBRIC ACID) 45 MG CPDR Take 45 mg by mouth daily.    Historical Provider, MD  cloNIDine (CATAPRES) 0.1 MG tablet Take 1 po prn daily for BP over 200 01/05/14   Janice Norrie, MD  hydrochlorothiazide (HYDRODIURIL) 25 MG tablet Take 12.5 mg by mouth daily.  Historical Provider, MD  omeprazole (PRILOSEC) 20 MG capsule Take 20 mg by mouth daily.    Historical Provider, MD   Triage Vitals: BP 155/65  Pulse 66  Temp(Src) 98.9 F (37.2 C) (Oral)  Resp 18  Ht 5\' 5"  (1.651 m)  Wt 185 lb (83.915 kg)  BMI 30.79 kg/m2  SpO2 100% Physical Exam  Constitutional: She is oriented to person, place, and time. She appears well-developed.  HENT:  Head: Normocephalic.  Eyes: Conjunctivae and EOM are normal. No scleral icterus.  Neck: Neck supple. No thyromegaly present.  Cardiovascular: Normal rate and regular rhythm.  Exam reveals no gallop and no  friction rub.   No murmur heard. Pulmonary/Chest: No stridor. She has no wheezes. She has no rales. She exhibits no tenderness.  Abdominal: She exhibits no distension. There is no tenderness. There is no rebound.  Musculoskeletal: Normal range of motion. She exhibits no edema.  Lymphadenopathy:    She has no cervical adenopathy.  Neurological: She is oriented to person, place, and time. She exhibits normal muscle tone. Coordination normal.  Skin: No rash noted. No erythema.  Psychiatric: She has a normal mood and affect. Her behavior is normal.    ED Course  Procedures (including critical care time)  DIAGNOSTIC STUDIES: Oxygen Saturation is 100% on RA, nl by my interpretation.      COORDINATION OF CARE: 7:05 PM-Discussed treatment plan   Labs Review Labs Reviewed - No data to display  Imaging Review Mr Brain Wo Contrast  01/05/2014   CLINICAL DATA:  80 year old female with dizziness altered mental status hypertension and numbness on the right side. Initial encounter.  EXAM: MRI HEAD WITHOUT CONTRAST  TECHNIQUE: Multiplanar, multiecho pulse sequences of the brain and surrounding structures were obtained without intravenous contrast.  COMPARISON:  Brain MRI 09/14/2012.  Head CT 04/16/2005.  FINDINGS: Stable cerebral volume. Major intracranial vascular flow voids are stable. No restricted diffusion to suggest acute infarction. No midline shift, mass effect, evidence of mass lesion, ventriculomegaly, extra-axial collection or acute intracranial hemorrhage. Cervicomedullary junction and pituitary are within normal limits. Grossly stable visualized cervical spine.  Stable minimal to mild for age nonspecific cerebral white matter T2 and FLAIR hyperintensity. No cortical encephalomalacia identified. Deep gray matter nuclei, brainstem and cerebellum remain within normal limits for age.  Visible internal auditory structures appear normal. Trace mastoid fluid is unchanged. Visualized orbit soft  tissues are within normal limits. Stable paranasal sinuses. Hyperostosis of the calvarium. Normal bone marrow signal. Stable mild prominence of the midline adenoids since 2006. Visualized scalp soft tissues are within normal limits.  IMPRESSION: No acute intracranial abnormality.   Electronically Signed   By: Lars Pinks M.D.   On: 01/05/2014 15:13     EKG Interpretation None      MDM   Final diagnoses:  None   Htn,  Pt told to check bp twice a week and take her bp medicine twice a day as instructed by her md   The chart was scribed for me under my direct supervision.  I personally performed the history, physical, and medical decision making and all procedures in the evaluation of this patient.. The chart was scribed for me under my direct supervision.  I personally performed the history, physical, and medical decision making and all procedures in the evaluation of this patient.Maudry Diego, MD 01/06/14 [phone]

## 2014-01-06 NOTE — ED Notes (Signed)
Pt seen here yesterday for hypertension.  Says it has been up today also. 205/103.

## 2014-01-06 NOTE — ED Notes (Signed)
States she took her BP at home and got a reading of 200/93 in right arm. Pt was asymptomatic, but took clonidine 0.1mg  that she was given yesterday. Now BP 155/77 right arm and 153/55 left arm. Pt still asymptomatic. States pmd told her to increased her regular BP med to twice a day. Pt unclear if she should take her additional dose tonight. Also states she is under a lot of stress, (might be loosing her home). Asking for medications for stress.

## 2014-01-06 NOTE — Discharge Instructions (Signed)
Start taking your bp medicine twice a day as instructed by your md.  Check your bp twice a week and follow up with your md as planned

## 2014-06-27 DIAGNOSIS — E118 Type 2 diabetes mellitus with unspecified complications: Secondary | ICD-10-CM | POA: Diagnosis not present

## 2014-06-27 DIAGNOSIS — E785 Hyperlipidemia, unspecified: Secondary | ICD-10-CM | POA: Diagnosis not present

## 2014-06-27 DIAGNOSIS — I10 Essential (primary) hypertension: Secondary | ICD-10-CM | POA: Diagnosis not present

## 2014-12-06 DIAGNOSIS — I10 Essential (primary) hypertension: Secondary | ICD-10-CM | POA: Diagnosis not present

## 2014-12-06 DIAGNOSIS — N390 Urinary tract infection, site not specified: Secondary | ICD-10-CM | POA: Diagnosis not present

## 2014-12-06 DIAGNOSIS — E118 Type 2 diabetes mellitus with unspecified complications: Secondary | ICD-10-CM | POA: Diagnosis not present

## 2014-12-06 DIAGNOSIS — M1990 Unspecified osteoarthritis, unspecified site: Secondary | ICD-10-CM | POA: Diagnosis not present

## 2014-12-06 DIAGNOSIS — E785 Hyperlipidemia, unspecified: Secondary | ICD-10-CM | POA: Diagnosis not present

## 2015-03-07 DIAGNOSIS — I10 Essential (primary) hypertension: Secondary | ICD-10-CM | POA: Diagnosis not present

## 2015-03-07 DIAGNOSIS — E118 Type 2 diabetes mellitus with unspecified complications: Secondary | ICD-10-CM | POA: Diagnosis not present

## 2015-03-07 DIAGNOSIS — R7309 Other abnormal glucose: Secondary | ICD-10-CM | POA: Diagnosis not present

## 2015-03-07 DIAGNOSIS — Z23 Encounter for immunization: Secondary | ICD-10-CM | POA: Diagnosis not present

## 2015-03-07 DIAGNOSIS — I110 Hypertensive heart disease with heart failure: Secondary | ICD-10-CM | POA: Diagnosis not present

## 2015-03-07 DIAGNOSIS — M1990 Unspecified osteoarthritis, unspecified site: Secondary | ICD-10-CM | POA: Diagnosis not present

## 2015-06-27 DIAGNOSIS — E118 Type 2 diabetes mellitus with unspecified complications: Secondary | ICD-10-CM | POA: Diagnosis not present

## 2015-06-27 DIAGNOSIS — N390 Urinary tract infection, site not specified: Secondary | ICD-10-CM | POA: Diagnosis not present

## 2015-06-27 DIAGNOSIS — Z0000 Encounter for general adult medical examination without abnormal findings: Secondary | ICD-10-CM | POA: Diagnosis not present

## 2015-06-27 DIAGNOSIS — I10 Essential (primary) hypertension: Secondary | ICD-10-CM | POA: Diagnosis not present

## 2015-09-25 DIAGNOSIS — E119 Type 2 diabetes mellitus without complications: Secondary | ICD-10-CM | POA: Diagnosis not present

## 2015-09-25 DIAGNOSIS — H2513 Age-related nuclear cataract, bilateral: Secondary | ICD-10-CM | POA: Diagnosis not present

## 2015-09-25 DIAGNOSIS — H25013 Cortical age-related cataract, bilateral: Secondary | ICD-10-CM | POA: Diagnosis not present

## 2015-10-16 DIAGNOSIS — H25042 Posterior subcapsular polar age-related cataract, left eye: Secondary | ICD-10-CM | POA: Diagnosis not present

## 2015-10-16 DIAGNOSIS — H2512 Age-related nuclear cataract, left eye: Secondary | ICD-10-CM | POA: Diagnosis not present

## 2015-10-31 NOTE — Patient Instructions (Signed)
Kiara Martinez  10/31/2015     @PREFPERIOPPHARMACY @   Your procedure is scheduled on 11/06/2015.  Report to [facility] at 11/06/2015 A.M.  Call this number if you have problems the morning of surgery:  [phone]   Remember:  Do not eat food or drink liquids after midnight.  Take these medicines the morning of surgery with A SIP OF WATER Xanax, Amlodipine, Catapress, Prilosec   Do not wear jewelry, make-up or nail polish.  Do not wear lotions, powders, or perfumes.  You may wear deoderant.  Do not shave 48 hours prior to surgery.  Men may shave face and neck.  Do not bring valuables to the hospital.  [facility] is not responsible for any belongings or valuables.  Contacts, dentures or bridgework may not be worn into surgery.  Leave your suitcase in the car.  After surgery it may be brought to your room.  For patients admitted to the hospital, discharge time will be determined by your treatment team.  Patients discharged the day of surgery will not be allowed to drive home.    Please read over the following fact sheets that you were given. Anesthesia Post-op Instructions     PATIENT INSTRUCTIONS POST-ANESTHESIA  IMMEDIATELY FOLLOWING SURGERY:  Do not drive or operate machinery for the first twenty four hours after surgery.  Do not make any important decisions for twenty four hours after surgery or while taking narcotic pain medications or sedatives.  If you develop intractable nausea and vomiting or a severe headache please notify your doctor immediately.  FOLLOW-UP:  Please make an appointment with your surgeon as instructed. You do not need to follow up with anesthesia unless specifically instructed to do so.  WOUND CARE INSTRUCTIONS (if applicable):  Keep a dry clean dressing on the anesthesia/puncture wound site if there is drainage.  Once the wound has quit draining you may leave it open to air.  Generally you should leave the bandage intact for twenty four hours unless  there is drainage.  If the epidural site drains for more than 36-48 hours please call the anesthesia department.  QUESTIONS?:  Please feel free to call your physician or the hospital operator if you have any questions, and they will be happy to assist you.       A cataract is a clouding of the lens of the eye. When a lens becomes cloudy, vision is reduced based on the degree and nature of the clouding. Surgery may be needed to improve vision. Surgery removes the cloudy lens and usually replaces it with a substitute lens (intraocular lens, IOL). LET YOUR EYE DOCTOR KNOW ABOUT:  Allergies to food or medicine.  Medicines taken including herbs, eye drops, over-the-counter medicines, and creams.  Use of steroids (by mouth or creams).  Previous problems with anesthetics or numbing medicine.  History of bleeding problems or blood clots.  Previous surgery.  Other health problems, including diabetes and kidney problems.  Possibility of pregnancy, if this applies. RISKS AND COMPLICATIONS  Infection.  Inflammation of the eyeball (endophthalmitis) that can spread to both eyes (sympathetic ophthalmia).  Poor wound healing.  If an IOL is inserted, it can later fall out of proper position. This is very uncommon.  Clouding of the part of your eye that holds an IOL in place. This is called an "after-cataract." These are uncommon but easily treated. BEFORE THE PROCEDURE  Do not eat or drink anything except small amounts of water for 8 to 12 before your  surgery, or as directed by your caregiver.  Unless you are told otherwise, continue any eye drops you have been prescribed.  Talk to your primary caregiver about all other medicines that you take (both prescription and nonprescription). In some cases, you may need to stop or change medicines near the time of your surgery. This is most important if you are taking blood-thinning medicine.Do not stop medicines unless you are told to do  so.  Arrange for someone to drive you to and from the procedure.  Do not put contact lenses in either eye on the day of your surgery. PROCEDURE There is more than one method for safely removing a cataract. Your doctor can explain the differences and help determine which is best for you. Phacoemulsification surgery is the most common form of cataract surgery.  An injection is given behind the eye or eye drops are given to make this a painless procedure.  A small cut (incision) is made on the edge of the clear, dome-shaped surface that covers the front of the eye (cornea).  A tiny probe is painlessly inserted into the eye. This device gives off ultrasound waves that soften and break up the cloudy center of the lens. This makes it easier for the cloudy lens to be removed by suction.  An IOL may be implanted.  The normal lens of the eye is covered by a clear capsule. Part of that capsule is intentionally left in the eye to support the IOL.  Your surgeon may or may not use stitches to close the incision. There are other forms of cataract surgery that require a larger incision and stitches to close the eye. This approach is taken in cases where the doctor feels that the cataract cannot be easily removed using phacoemulsification. AFTER THE PROCEDURE  When an IOL is implanted, it does not need care. It becomes a permanent part of your eye and cannot be seen or felt.  Your doctor will schedule follow-up exams to check on your progress.  Review your other medicines with your doctor to see which can be resumed after surgery.  Use eye drops or take medicine as prescribed by your doctor.   This information is not intended to replace advice given to you by your health care provider. Make sure you discuss any questions you have with your health care provider.   Document Released: 04/17/2011 Document Revised: 05/19/2014 Document Reviewed: 04/17/2011 Elsevier Interactive Patient Education [employer]

## 2015-11-01 ENCOUNTER — Encounter (HOSPITAL_COMMUNITY)
Admission: RE | Admit: 2015-11-01 | Discharge: 2015-11-01 | Disposition: A | Discharge status: Home / Self Care | Payer: Medicare Other | Source: Ambulatory Visit | Attending: Ophthalmology | Admitting: Ophthalmology

## 2015-11-05 ENCOUNTER — Encounter (HOSPITAL_COMMUNITY)
Admission: RE | Admit: 2015-11-05 | Discharge: 2015-11-05 | Disposition: A | Discharge status: Home / Self Care | Payer: Medicare Other | Source: Ambulatory Visit | Attending: Ophthalmology | Admitting: Ophthalmology

## 2015-11-05 ENCOUNTER — Encounter (HOSPITAL_COMMUNITY): Payer: Self-pay

## 2015-11-05 DIAGNOSIS — Z01810 Encounter for preprocedural cardiovascular examination: Secondary | ICD-10-CM | POA: Diagnosis not present

## 2015-11-05 DIAGNOSIS — Z87891 Personal history of nicotine dependence: Secondary | ICD-10-CM | POA: Diagnosis not present

## 2015-11-05 DIAGNOSIS — H268 Other specified cataract: Secondary | ICD-10-CM | POA: Diagnosis not present

## 2015-11-05 DIAGNOSIS — I10 Essential (primary) hypertension: Secondary | ICD-10-CM | POA: Diagnosis not present

## 2015-11-05 DIAGNOSIS — Z01812 Encounter for preprocedural laboratory examination: Secondary | ICD-10-CM | POA: Diagnosis not present

## 2015-11-05 DIAGNOSIS — E119 Type 2 diabetes mellitus without complications: Secondary | ICD-10-CM | POA: Diagnosis not present

## 2015-11-05 DIAGNOSIS — M1990 Unspecified osteoarthritis, unspecified site: Secondary | ICD-10-CM | POA: Diagnosis not present

## 2015-11-05 DIAGNOSIS — Z7982 Long term (current) use of aspirin: Secondary | ICD-10-CM | POA: Diagnosis not present

## 2015-11-05 DIAGNOSIS — K219 Gastro-esophageal reflux disease without esophagitis: Secondary | ICD-10-CM | POA: Diagnosis not present

## 2015-11-05 DIAGNOSIS — F419 Anxiety disorder, unspecified: Secondary | ICD-10-CM | POA: Diagnosis not present

## 2015-11-05 HISTORY — DX: Gastro-esophageal reflux disease without esophagitis: K21.9

## 2015-11-05 HISTORY — DX: Anxiety disorder, unspecified: F41.9

## 2015-11-05 LAB — CBC
HEMATOCRIT: 40.6 % (ref 36.0–46.0)
Hemoglobin: 12.9 g/dL (ref 12.0–15.0)
MCH: 28.6 pg (ref 26.0–34.0)
MCHC: 31.8 g/dL (ref 30.0–36.0)
MCV: 90 fL (ref 78.0–100.0)
PLATELETS: 278 10*3/uL (ref 150–400)
RBC: 4.51 MIL/uL (ref 3.87–5.11)
RDW: 12.9 % (ref 11.5–15.5)
WBC: 9.9 10*3/uL (ref 4.0–10.5)

## 2015-11-05 LAB — BASIC METABOLIC PANEL
Anion gap: 4 — ABNORMAL LOW (ref 5–15)
BUN: 22 mg/dL — AB (ref 6–20)
CHLORIDE: 106 mmol/L (ref 101–111)
CO2: 31 mmol/L (ref 22–32)
CREATININE: 0.84 mg/dL (ref 0.44–1.00)
Calcium: 9.3 mg/dL (ref 8.9–10.3)
GFR calc non Af Amer: >60 mL/min (ref >60)
GLUCOSE: 108 mg/dL — AB (ref 65–99)
POTASSIUM: 4.3 mmol/L (ref 3.5–5.1)
Sodium: 141 mmol/L (ref 135–145)

## 2015-11-05 NOTE — Anesthesia Preprocedure Evaluation (Addendum)
Anesthesia Evaluation  Patient identified by MRN, date of birth, ID band Patient awake    Reviewed: Allergy & Precautions, NPO status , Patient's Chart, lab work & pertinent test results  Airway Mallampati: I  TM Distance: >3 FB Neck ROM: Full    Dental  (+) Edentulous Upper, Edentulous Lower   Pulmonary former smoker,    breath sounds clear to auscultation       Cardiovascular hypertension, Pt. on medications  Rhythm:Regular Rate:Normal     Neuro/Psych PSYCHIATRIC DISORDERS Anxiety negative neurological ROS     GI/Hepatic Neg liver ROS, GERD  Medicated,  Endo/Other  diabetes  Renal/GU negative Renal ROS  negative genitourinary   Musculoskeletal negative musculoskeletal ROS (+)   Abdominal   Peds negative pediatric ROS (+)  Hematology negative hematology ROS (+)   Anesthesia Other Findings   Reproductive/Obstetrics negative OB ROS                            Lab Results  Component Value Date   WBC 9.1 09/14/2012   HGB 13.7 09/14/2012   HCT 40.4 09/14/2012   MCV 87.3 09/14/2012   PLT 329 09/14/2012   Lab Results  Component Value Date   CREATININE 0.86 09/14/2012   BUN 14 09/14/2012   NA 139 09/14/2012   K 3.3* 09/14/2012   CL 101 09/14/2012   CO2 29 09/14/2012   Lab Results  Component Value Date   INR 0.96 09/14/2012    Anesthesia Physical Anesthesia Plan  ASA: II  Anesthesia Plan: MAC   Post-op Pain Management:    Induction: Intravenous  Airway Management Planned: Nasal Cannula  Additional Equipment:   Intra-op Plan:   Post-operative Plan:   Informed Consent: I have reviewed the patients History and Physical, chart, labs and discussed the procedure including the risks, benefits and alternatives for the proposed anesthesia with the patient or authorized representative who has indicated his/her understanding and acceptance.     Plan Discussed with:  CRNA  Anesthesia Plan Comments:         Anesthesia Quick Evaluation

## 2015-11-05 NOTE — Pre-Procedure Instructions (Signed)
Patient given information to sign up for my chart at home. 

## 2015-11-06 ENCOUNTER — Encounter (HOSPITAL_COMMUNITY)
Admission: RE | Disposition: A | Discharge status: Home / Self Care | Payer: Self-pay | Source: Ambulatory Visit | Attending: Ophthalmology

## 2015-11-06 ENCOUNTER — Ambulatory Visit (HOSPITAL_COMMUNITY): Payer: Medicare Other | Admitting: Anesthesiology

## 2015-11-06 ENCOUNTER — Ambulatory Visit (HOSPITAL_COMMUNITY)
Admission: RE | Admit: 2015-11-06 | Discharge: 2015-11-06 | Disposition: A | Discharge status: Home / Self Care | Payer: Medicare Other | Source: Ambulatory Visit | Attending: Ophthalmology | Admitting: Ophthalmology

## 2015-11-06 ENCOUNTER — Encounter (HOSPITAL_COMMUNITY): Payer: Self-pay | Admitting: *Deleted

## 2015-11-06 DIAGNOSIS — Z7982 Long term (current) use of aspirin: Secondary | ICD-10-CM | POA: Insufficient documentation

## 2015-11-06 DIAGNOSIS — Z01812 Encounter for preprocedural laboratory examination: Secondary | ICD-10-CM | POA: Insufficient documentation

## 2015-11-06 DIAGNOSIS — E119 Type 2 diabetes mellitus without complications: Secondary | ICD-10-CM | POA: Insufficient documentation

## 2015-11-06 DIAGNOSIS — H25012 Cortical age-related cataract, left eye: Secondary | ICD-10-CM | POA: Diagnosis not present

## 2015-11-06 DIAGNOSIS — K219 Gastro-esophageal reflux disease without esophagitis: Secondary | ICD-10-CM | POA: Insufficient documentation

## 2015-11-06 DIAGNOSIS — Z01810 Encounter for preprocedural cardiovascular examination: Secondary | ICD-10-CM | POA: Diagnosis not present

## 2015-11-06 DIAGNOSIS — H25041 Posterior subcapsular polar age-related cataract, right eye: Secondary | ICD-10-CM | POA: Diagnosis not present

## 2015-11-06 DIAGNOSIS — H2512 Age-related nuclear cataract, left eye: Secondary | ICD-10-CM | POA: Diagnosis not present

## 2015-11-06 DIAGNOSIS — F419 Anxiety disorder, unspecified: Secondary | ICD-10-CM | POA: Insufficient documentation

## 2015-11-06 DIAGNOSIS — I10 Essential (primary) hypertension: Secondary | ICD-10-CM | POA: Insufficient documentation

## 2015-11-06 DIAGNOSIS — H268 Other specified cataract: Secondary | ICD-10-CM | POA: Diagnosis not present

## 2015-11-06 DIAGNOSIS — Z87891 Personal history of nicotine dependence: Secondary | ICD-10-CM | POA: Insufficient documentation

## 2015-11-06 DIAGNOSIS — M1990 Unspecified osteoarthritis, unspecified site: Secondary | ICD-10-CM | POA: Diagnosis not present

## 2015-11-06 DIAGNOSIS — H2511 Age-related nuclear cataract, right eye: Secondary | ICD-10-CM | POA: Diagnosis not present

## 2015-11-06 HISTORY — PX: CATARACT EXTRACTION W/PHACO: SHX586

## 2015-11-06 LAB — GLUCOSE, CAPILLARY: Glucose-Capillary: 94 mg/dL (ref 65–99)

## 2015-11-06 SURGERY — PHACOEMULSIFICATION, CATARACT, WITH IOL INSERTION
Anesthesia: Monitor Anesthesia Care | Site: Eye | Laterality: Left

## 2015-11-06 MED ORDER — ONDANSETRON HCL 4 MG/2ML IJ SOLN
4.0000 mg | Freq: Once | INTRAMUSCULAR | Status: AC
  Administered 2015-11-06: 4 mg via INTRAVENOUS
  Filled 2015-11-06: qty 2

## 2015-11-06 MED ORDER — TETRACAINE HCL 0.5 % OP SOLN
1.0000 [drp] | OPHTHALMIC | Status: AC
  Administered 2015-11-06 (×3): 1 [drp] via OPHTHALMIC

## 2015-11-06 MED ORDER — KETOROLAC TROMETHAMINE 0.5 % OP SOLN
1.0000 [drp] | OPHTHALMIC | Status: AC
  Administered 2015-11-06 (×3): 1 [drp] via OPHTHALMIC

## 2015-11-06 MED ORDER — FENTANYL CITRATE (PF) 100 MCG/2ML IJ SOLN
25.0000 ug | INTRAMUSCULAR | Status: DC | PRN
  Administered 2015-11-06: 25 ug via INTRAVENOUS
  Filled 2015-11-06: qty 2

## 2015-11-06 MED ORDER — LACTATED RINGERS IV SOLN
INTRAVENOUS | Status: DC
  Administered 2015-11-06: 10:00:00 via INTRAVENOUS

## 2015-11-06 MED ORDER — BSS IO SOLN
INTRAOCULAR | Status: DC | PRN
  Administered 2015-11-06: 15 mL

## 2015-11-06 MED ORDER — MIDAZOLAM HCL 2 MG/2ML IJ SOLN
1.0000 mg | INTRAMUSCULAR | Status: DC | PRN
  Administered 2015-11-06 (×2): 1 mg via INTRAVENOUS
  Filled 2015-11-06: qty 2

## 2015-11-06 MED ORDER — EPINEPHRINE HCL 1 MG/ML IJ SOLN
INTRAMUSCULAR | Status: AC
  Filled 2015-11-06: qty 1

## 2015-11-06 MED ORDER — PHENYLEPHRINE HCL 2.5 % OP SOLN
1.0000 [drp] | OPHTHALMIC | Status: AC
  Administered 2015-11-06 (×3): 1 [drp] via OPHTHALMIC

## 2015-11-06 MED ORDER — EPINEPHRINE HCL 1 MG/ML IJ SOLN
INTRAMUSCULAR | Status: DC | PRN
  Administered 2015-11-06: 500 mL

## 2015-11-06 MED ORDER — PROVISC 10 MG/ML IO SOLN
INTRAOCULAR | Status: DC | PRN
  Administered 2015-11-06: 0.85 mL via INTRAOCULAR

## 2015-11-06 MED ORDER — CYCLOPENTOLATE-PHENYLEPHRINE 0.2-1 % OP SOLN
1.0000 [drp] | OPHTHALMIC | Status: AC
  Administered 2015-11-06 (×3): 1 [drp] via OPHTHALMIC

## 2015-11-06 SURGICAL SUPPLY — 10 items

## 2015-11-06 NOTE — H&P (Signed)
The patient was re examined and there is no change in the patients condition since the original H and P. 

## 2015-11-06 NOTE — Transfer of Care (Signed)
Immediate Anesthesia Transfer of Care Note  Patient: Kiara Martinez  Procedure(s) Performed: Procedure(s) (LRB): CATARACT EXTRACTION PHACO AND INTRAOCULAR LENS PLACEMENT (IOC) (Left)  Patient Location: Shortstay  Anesthesia Type: MAC  Level of Consciousness: awake  Airway & Oxygen Therapy: Patient Spontanous Breathing   Post-op Assessment: Report given to PACU RN, Post -op Vital signs reviewed and stable and Patient moving all extremities  Post vital signs: Reviewed and stable  Complications: No apparent anesthesia complications

## 2015-11-06 NOTE — Discharge Instructions (Signed)
°  °          [facility] Instructions °[address]- [place] [address]-[place] °    ° °1. Avoid closing eyes tightly. One often closes the eye tightly when laughing, talking, sneezing, coughing or if they feel irritated. At these times, you should be careful not to close your eyes tightly. ° °2. Instill eye drops as instructed. To instill drops in your eye, open it, look up and have someone gently pull the lower lid down and instill a couple of drops inside the lower lid. ° °3. Do not touch upper lid. ° °4. Take Advil or Tylenol for pain. ° °5. You may use either eye for near work, such as reading or sewing and you may watch television. ° °6. You may have your hair done at the beauty parlor at any time. ° °7. Wear dark glasses with or without your own glasses if you are in bright light. ° °8. Call our office at [phone] or [phone] if you have sharp pain in your eye or unusual symptoms. ° °9.  FOLLOW UP WITH DR. SHAPIRO TODAY IN HIS [place] OFFICE AT 2:45pm. ° °  °I have received a copy of the above instructions and will follow them.  ° ° ° °IF YOU ARE IN IMMEDIATE DANGER CALL 911! ° °It is important for you to keep your follow-up appointment with your physician after discharge, OR, for you /your caregiver to make a follow-up appointment with your physician / medical provider after discharge. ° °Show these instructions to the next healthcare provider you see. ° °

## 2015-11-06 NOTE — Anesthesia Postprocedure Evaluation (Signed)
  Anesthesia Post-op Note  Patient: Kiara Martinez  Procedure(s) Performed: Procedure(s) (LRB): CATARACT EXTRACTION PHACO AND INTRAOCULAR LENS PLACEMENT (IOC) (Left)  Patient Location:  Short Stay  Anesthesia Type: MAC  Level of Consciousness: awake  Airway and Oxygen Therapy: Patient Spontanous Breathing  Post-op Pain: none  Post-op Assessment: Post-op Vital signs reviewed, Patient's Cardiovascular Status Stable, Respiratory Function Stable, Patent Airway, No signs of Nausea or vomiting and Pain level controlled  Post-op Vital Signs: Reviewed and stable  Complications: No apparent anesthesia complications

## 2015-11-06 NOTE — Op Note (Signed)
Patient brought to the operating room and prepped and draped in the usual manner.  Lid speculum inserted in left eye.  Stab incision made at the twelve o'clock position.  Provisc instilled in the anterior chamber.   A 2.4 mm. Stab incision was made temporally.  An anterior capsulotomy was done with a bent 25 gauge needle.  The nucleus was hydrodissected.  The Phaco tip was inserted in the anterior chamber and the nucleus was emulsified.  CDE was 14.60.  The cortical material was then removed with the I and A tip.  Posterior capsule was the polished.  The anterior chamber was deepened with Provisc.  A 21.0 Diopter Hoya Model 250 IOL was then inserted in the capsular bag.  Provisc was then removed with the I and A tip.  The wound was then hydrated.  Patient sent to the Recovery Room in good condition with follow up in my office.  Preoperative Diagnosis:  Nuclear and PSC Cataract OS Postoperative Diagnosis:  Same Procedure name: Kelman Phacoemulsification OS with IOL

## 2015-11-07 ENCOUNTER — Encounter (HOSPITAL_COMMUNITY): Payer: Self-pay | Admitting: Ophthalmology

## 2015-11-15 ENCOUNTER — Encounter (HOSPITAL_COMMUNITY)
Admission: RE | Admit: 2015-11-15 | Discharge: 2015-11-15 | Disposition: A | Discharge status: Home / Self Care | Payer: Medicare Other | Source: Ambulatory Visit | Attending: Ophthalmology | Admitting: Ophthalmology

## 2015-11-20 ENCOUNTER — Encounter (HOSPITAL_COMMUNITY)
Admission: RE | Disposition: A | Discharge status: Home / Self Care | Payer: Self-pay | Source: Ambulatory Visit | Attending: Ophthalmology

## 2015-11-20 ENCOUNTER — Ambulatory Visit (HOSPITAL_COMMUNITY): Payer: Medicare Other | Admitting: Anesthesiology

## 2015-11-20 ENCOUNTER — Encounter (HOSPITAL_COMMUNITY): Payer: Self-pay | Admitting: *Deleted

## 2015-11-20 ENCOUNTER — Ambulatory Visit (HOSPITAL_COMMUNITY)
Admission: RE | Admit: 2015-11-20 | Discharge: 2015-11-20 | Disposition: A | Discharge status: Home / Self Care | Payer: Medicare Other | Source: Ambulatory Visit | Attending: Ophthalmology | Admitting: Ophthalmology

## 2015-11-20 DIAGNOSIS — Z79899 Other long term (current) drug therapy: Secondary | ICD-10-CM | POA: Insufficient documentation

## 2015-11-20 DIAGNOSIS — I10 Essential (primary) hypertension: Secondary | ICD-10-CM | POA: Diagnosis not present

## 2015-11-20 DIAGNOSIS — H25041 Posterior subcapsular polar age-related cataract, right eye: Secondary | ICD-10-CM | POA: Insufficient documentation

## 2015-11-20 DIAGNOSIS — H2511 Age-related nuclear cataract, right eye: Secondary | ICD-10-CM | POA: Insufficient documentation

## 2015-11-20 DIAGNOSIS — E1136 Type 2 diabetes mellitus with diabetic cataract: Secondary | ICD-10-CM | POA: Insufficient documentation

## 2015-11-20 DIAGNOSIS — Z87891 Personal history of nicotine dependence: Secondary | ICD-10-CM | POA: Diagnosis not present

## 2015-11-20 DIAGNOSIS — K219 Gastro-esophageal reflux disease without esophagitis: Secondary | ICD-10-CM | POA: Diagnosis not present

## 2015-11-20 DIAGNOSIS — H269 Unspecified cataract: Secondary | ICD-10-CM | POA: Diagnosis not present

## 2015-11-20 DIAGNOSIS — Z7982 Long term (current) use of aspirin: Secondary | ICD-10-CM | POA: Insufficient documentation

## 2015-11-20 HISTORY — PX: CATARACT EXTRACTION W/PHACO: SHX586

## 2015-11-20 LAB — GLUCOSE, CAPILLARY: Glucose-Capillary: 102 mg/dL — ABNORMAL HIGH (ref 65–99)

## 2015-11-20 SURGERY — PHACOEMULSIFICATION, CATARACT, WITH IOL INSERTION
Anesthesia: Monitor Anesthesia Care | Site: Eye | Laterality: Right

## 2015-11-20 MED ORDER — CYCLOPENTOLATE-PHENYLEPHRINE 0.2-1 % OP SOLN
1.0000 [drp] | OPHTHALMIC | Status: AC
  Administered 2015-11-20 (×3): 1 [drp] via OPHTHALMIC

## 2015-11-20 MED ORDER — MIDAZOLAM HCL 2 MG/2ML IJ SOLN
1.0000 mg | INTRAMUSCULAR | Status: DC | PRN
  Administered 2015-11-20: 2 mg via INTRAVENOUS

## 2015-11-20 MED ORDER — PROVISC 10 MG/ML IO SOLN
INTRAOCULAR | Status: DC | PRN
  Administered 2015-11-20: 0.85 mL via INTRAOCULAR

## 2015-11-20 MED ORDER — EPINEPHRINE HCL 1 MG/ML IJ SOLN
INTRAOCULAR | Status: DC | PRN
  Administered 2015-11-20: 500 mL

## 2015-11-20 MED ORDER — LIDOCAINE HCL 3.5 % OP GEL: 1.0000 "application " | Freq: Once | OPHTHALMIC | Status: DC

## 2015-11-20 MED ORDER — TETRACAINE 0.5 % OP SOLN OPTIME - NO CHARGE
OPHTHALMIC | Status: DC | PRN
  Administered 2015-11-20: 2 [drp] via OPHTHALMIC

## 2015-11-20 MED ORDER — MIDAZOLAM HCL 2 MG/2ML IJ SOLN
INTRAMUSCULAR | Status: AC
  Filled 2015-11-20: qty 2

## 2015-11-20 MED ORDER — FENTANYL CITRATE (PF) 100 MCG/2ML IJ SOLN
INTRAMUSCULAR | Status: AC
  Filled 2015-11-20: qty 2

## 2015-11-20 MED ORDER — TETRACAINE HCL 0.5 % OP SOLN
1.0000 [drp] | OPHTHALMIC | Status: AC
  Administered 2015-11-20 (×3): 1 [drp] via OPHTHALMIC

## 2015-11-20 MED ORDER — BSS IO SOLN
INTRAOCULAR | Status: DC | PRN
  Administered 2015-11-20: 15 mL

## 2015-11-20 MED ORDER — PHENYLEPHRINE HCL 2.5 % OP SOLN
1.0000 [drp] | OPHTHALMIC | Status: AC
  Administered 2015-11-20 (×3): 1 [drp] via OPHTHALMIC

## 2015-11-20 MED ORDER — FENTANYL CITRATE (PF) 100 MCG/2ML IJ SOLN
25.0000 ug | INTRAMUSCULAR | Status: DC | PRN
  Administered 2015-11-20: 25 ug via INTRAVENOUS

## 2015-11-20 MED ORDER — EPINEPHRINE HCL 1 MG/ML IJ SOLN
INTRAMUSCULAR | Status: AC
  Filled 2015-11-20: qty 1

## 2015-11-20 MED ORDER — KETOROLAC TROMETHAMINE 0.5 % OP SOLN
1.0000 [drp] | OPHTHALMIC | Status: AC
  Administered 2015-11-20 (×3): 1 [drp] via OPHTHALMIC

## 2015-11-20 MED ORDER — LACTATED RINGERS IV SOLN
INTRAVENOUS | Status: DC
  Administered 2015-11-20: 09:00:00 via INTRAVENOUS
  Administered 2015-11-20: 1000 mL via INTRAVENOUS

## 2015-11-20 SURGICAL SUPPLY — 10 items

## 2015-11-20 NOTE — Op Note (Signed)
Patient brought to the operating room and prepped and draped in the usual manner.  Lid speculum inserted in right eye.  Stab incision made at the twelve o'clock position.  Provisc instilled in the anterior chamber.   A 2.4 mm. Stab incision was made temporally.  An anterior capsulotomy was done with a bent 25 gauge needle.  The nucleus was hydrodissected.  The Phaco tip was inserted in the anterior chamber and the nucleus was emulsified.  CDE was 7.12.  The cortical material was then removed with the I and A tip.  Posterior capsule was the polished.  The anterior chamber was deepened with Provisc.  A 22.0 Diopter Hoya Model 250 IOL was then inserted in the capsular bag.  Provisc was then removed with the I and A tip.  The wound was then hydrated.  Patient sent to the Recovery Room in good condition with follow up in my office.  Preoperative Diagnosis:  Nuclear and PSC Cataract OD Postoperative Diagnosis:  Same Procedure name: Kelman Phacoemulsification OD with IOL

## 2015-11-20 NOTE — Discharge Instructions (Signed)
°  °          [facility] Instructions °[address]- [place] [address]-[place] °    ° °1. Avoid closing eyes tightly. One often closes the eye tightly when laughing, talking, sneezing, coughing or if they feel irritated. At these times, you should be careful not to close your eyes tightly. ° °2. Instill eye drops as instructed. To instill drops in your eye, open it, look up and have someone gently pull the lower lid down and instill a couple of drops inside the lower lid. ° °3. Do not touch upper lid. ° °4. Take Advil or Tylenol for pain. ° °5. You may use either eye for near work, such as reading or sewing and you may watch television. ° °6. You may have your hair done at the beauty parlor at any time. ° °7. Wear dark glasses with or without your own glasses if you are in bright light. ° °8. Call our office at [phone] or [phone] if you have sharp pain in your eye or unusual symptoms. ° °9.  FOLLOW UP WITH DR. SHAPIRO TODAY IN HIS [place] OFFICE AT 2:45pm. ° °  °I have received a copy of the above instructions and will follow them.  ° ° ° °IF YOU ARE IN IMMEDIATE DANGER CALL 911! ° °It is important for you to keep your follow-up appointment with your physician after discharge, OR, for you /your caregiver to make a follow-up appointment with your physician / medical provider after discharge. ° °Show these instructions to the next healthcare provider you see. ° Anesthesia, Adult, Care After °Refer to this sheet in the next few weeks. These instructions provide you with information on caring for yourself after your procedure. Your health care provider may also give you more specific instructions. Your treatment has been planned according to current medical practices, but problems sometimes occur. Call your health care provider if you have any problems or questions after your procedure. °WHAT TO EXPECT AFTER THE PROCEDURE °After the procedure, it is typical to  experience: °· Sleepiness. °· Nausea and vomiting. °HOME CARE INSTRUCTIONS °· For the first 24 hours after general anesthesia: °¨ Have a responsible person with you. °¨ Do not drive a car. If you are alone, do not take public transportation. °¨ Do not drink alcohol. °¨ Do not take medicine that has not been prescribed by your health care provider. °¨ Do not sign important papers or make important decisions. °¨ You may resume a normal diet and activities as directed by your health care provider. °· Change bandages (dressings) as directed. °· If you have questions or problems that seem related to general anesthesia, call the hospital and ask for the anesthetist or anesthesiologist on call. °SEEK MEDICAL CARE IF: °· You have nausea and vomiting that continue the day after anesthesia. °· You develop a rash. °SEEK IMMEDIATE MEDICAL CARE IF:  °· You have difficulty breathing. °· You have chest pain. °· You have any allergic problems. °  °This information is not intended to replace advice given to you by your health care provider. Make sure you discuss any questions you have with your health care provider. °  °Document Released: 08/04/2000 Document Revised: 05/19/2014 Document Reviewed: 08/27/2011 °Elsevier Interactive Patient Education ©2016 [employer] ° °

## 2015-11-20 NOTE — Anesthesia Postprocedure Evaluation (Signed)
Anesthesia Post Note  Patient: Kiara Martinez  Procedure(s) Performed: Procedure(s) (LRB): CATARACT EXTRACTION PHACO AND INTRAOCULAR LENS PLACEMENT (IOC) (Right)  Patient location during evaluation: Short Stay Anesthesia Type: MAC Level of consciousness: awake and alert and oriented Pain management: pain level controlled Vital Signs Assessment: post-procedure vital signs reviewed and stable Respiratory status: spontaneous breathing Cardiovascular status: blood pressure returned to baseline, stable and bradycardic Postop Assessment: no headache, no backache, no signs of nausea or vomiting and adequate PO intake Anesthetic complications: no    Last Vitals:  Filed Vitals:   11/20/15 0915 11/20/15 0920  BP: 127/51 123/54  Pulse:    Temp:    Resp: 14 12    Last Pain:  Filed Vitals:   11/20/15 0922  PainSc: 0-No pain                 Shaneal Barasch

## 2015-11-20 NOTE — Transfer of Care (Signed)
Immediate Anesthesia Transfer of Care Note  Patient: Kiara Martinez  Procedure(s) Performed: Procedure(s) with comments: CATARACT EXTRACTION PHACO AND INTRAOCULAR LENS PLACEMENT (IOC) (Right) - CDE: 7.12  Patient Location: Short Stay  Anesthesia Type:MAC  Level of Consciousness: awake, alert , oriented and patient cooperative  Airway & Oxygen Therapy: Patient Spontanous Breathing  Post-op Assessment: Report given to RN and Post -op Vital signs reviewed and stable  Post vital signs: Reviewed and stable  Last Vitals:  Filed Vitals:   11/20/15 0915 11/20/15 0920  BP: 127/51 123/54  Pulse:    Temp:    Resp: 14 12    Last Pain:  Filed Vitals:   11/20/15 0922  PainSc: 0-No pain      Patients Stated Pain Goal: 5 ([id number])  Complications: No apparent anesthesia complications

## 2015-11-20 NOTE — H&P (Signed)
The patient was re examined and there is no change in the patients condition since the original H and P. 

## 2015-11-20 NOTE — Anesthesia Preprocedure Evaluation (Signed)
Anesthesia Evaluation  Patient identified by MRN, date of birth, ID band Patient awake    Reviewed: Allergy & Precautions, NPO status , Patient's Chart, lab work & pertinent test results  Airway Mallampati: I  TM Distance: >3 FB Neck ROM: Full    Dental  (+) Edentulous Upper, Edentulous Lower   Pulmonary former smoker,    breath sounds clear to auscultation       Cardiovascular hypertension, Pt. on medications  Rhythm:Regular Rate:Normal     Neuro/Psych PSYCHIATRIC DISORDERS Anxiety negative neurological ROS     GI/Hepatic Neg liver ROS, GERD  Medicated,  Endo/Other  diabetes, Type 2  Renal/GU negative Renal ROS  negative genitourinary   Musculoskeletal negative musculoskeletal ROS (+)   Abdominal   Peds negative pediatric ROS (+)  Hematology negative hematology ROS (+)   Anesthesia Other Findings   Reproductive/Obstetrics negative OB ROS                             Anesthesia Physical Anesthesia Plan  ASA: II  Anesthesia Plan: MAC   Post-op Pain Management:    Induction: Intravenous  Airway Management Planned: Nasal Cannula  Additional Equipment:   Intra-op Plan:   Post-operative Plan:   Informed Consent: I have reviewed the patients History and Physical, chart, labs and discussed the procedure including the risks, benefits and alternatives for the proposed anesthesia with the patient or authorized representative who has indicated his/her understanding and acceptance.     Plan Discussed with: CRNA  Anesthesia Plan Comments:         Anesthesia Quick Evaluation

## 2015-11-22 ENCOUNTER — Encounter (HOSPITAL_COMMUNITY): Payer: Self-pay | Admitting: Ophthalmology

## 2015-12-26 DIAGNOSIS — Z961 Presence of intraocular lens: Secondary | ICD-10-CM | POA: Diagnosis not present

## 2016-07-07 DIAGNOSIS — I10 Essential (primary) hypertension: Secondary | ICD-10-CM | POA: Diagnosis not present

## 2016-07-07 DIAGNOSIS — J029 Acute pharyngitis, unspecified: Secondary | ICD-10-CM | POA: Diagnosis not present

## 2016-07-07 DIAGNOSIS — E118 Type 2 diabetes mellitus with unspecified complications: Secondary | ICD-10-CM | POA: Diagnosis not present

## 2016-10-08 DIAGNOSIS — I10 Essential (primary) hypertension: Secondary | ICD-10-CM | POA: Diagnosis not present

## 2016-10-08 DIAGNOSIS — E785 Hyperlipidemia, unspecified: Secondary | ICD-10-CM | POA: Diagnosis not present

## 2016-10-08 DIAGNOSIS — E118 Type 2 diabetes mellitus with unspecified complications: Secondary | ICD-10-CM | POA: Diagnosis not present

## 2017-02-16 DIAGNOSIS — Z0000 Encounter for general adult medical examination without abnormal findings: Secondary | ICD-10-CM | POA: Diagnosis not present

## 2017-02-16 DIAGNOSIS — N390 Urinary tract infection, site not specified: Secondary | ICD-10-CM | POA: Diagnosis not present

## 2017-02-16 DIAGNOSIS — Z23 Encounter for immunization: Secondary | ICD-10-CM | POA: Diagnosis not present

## 2017-02-16 DIAGNOSIS — N399 Disorder of urinary system, unspecified: Secondary | ICD-10-CM | POA: Diagnosis not present

## 2017-02-16 DIAGNOSIS — E118 Type 2 diabetes mellitus with unspecified complications: Secondary | ICD-10-CM | POA: Diagnosis not present

## 2017-02-16 DIAGNOSIS — E785 Hyperlipidemia, unspecified: Secondary | ICD-10-CM | POA: Diagnosis not present

## 2017-02-16 DIAGNOSIS — I10 Essential (primary) hypertension: Secondary | ICD-10-CM | POA: Diagnosis not present

## 2017-06-15 DIAGNOSIS — E118 Type 2 diabetes mellitus with unspecified complications: Secondary | ICD-10-CM | POA: Diagnosis not present

## 2017-06-15 DIAGNOSIS — I10 Essential (primary) hypertension: Secondary | ICD-10-CM | POA: Diagnosis not present

## 2017-06-15 DIAGNOSIS — R001 Bradycardia, unspecified: Secondary | ICD-10-CM | POA: Diagnosis not present

## 2017-09-14 DIAGNOSIS — I10 Essential (primary) hypertension: Secondary | ICD-10-CM | POA: Diagnosis not present

## 2017-09-14 DIAGNOSIS — E118 Type 2 diabetes mellitus with unspecified complications: Secondary | ICD-10-CM | POA: Diagnosis not present

## 2018-02-19 ENCOUNTER — Other Ambulatory Visit: Payer: Self-pay | Admitting: Pharmacist

## 2018-02-19 NOTE — Patient Outreach (Signed)
[facility]) Care Management  02/19/2018  Monna Crean [birth year] [id number]   Incoming call from Oleh Genin in response to the [facility] Medication Adherence Campaign. Speak with patient. HIPAA identifiers verified and verbal consent received.  Ms. Zipp reports that she takes both her amlodipine-benazepril and hydrochlorothiazide each once daily as directed. Denies any missed doses or barriers to adherence. Counsel on the importance of medication adherence. Reports that she uses a weekly pillbox to organize her medications. Note that per Epic Medication Dispensing Report, patient last had both medications refilled on 01/13/18 for 90 day supplies. Patient denies checking her blood pressure at home, but reports that "it has been good when I go to the doctor's office".  Patient denies any medication questions/concerns at this time. Will close pharmacy episode.  Harlow Asa, PharmD, [facility] [phone]

## 2018-03-22 DIAGNOSIS — Z0000 Encounter for general adult medical examination without abnormal findings: Secondary | ICD-10-CM | POA: Diagnosis not present

## 2018-03-22 DIAGNOSIS — E782 Mixed hyperlipidemia: Secondary | ICD-10-CM | POA: Diagnosis not present

## 2018-03-22 DIAGNOSIS — E118 Type 2 diabetes mellitus with unspecified complications: Secondary | ICD-10-CM | POA: Diagnosis not present

## 2018-03-22 DIAGNOSIS — I10 Essential (primary) hypertension: Secondary | ICD-10-CM | POA: Diagnosis not present

## 2018-06-21 DIAGNOSIS — E785 Hyperlipidemia, unspecified: Secondary | ICD-10-CM | POA: Diagnosis not present

## 2018-06-21 DIAGNOSIS — E1100 Type 2 diabetes mellitus with hyperosmolarity without nonketotic hyperglycemic-hyperosmolar coma (NKHHC): Secondary | ICD-10-CM | POA: Diagnosis not present

## 2018-06-21 DIAGNOSIS — I10 Essential (primary) hypertension: Secondary | ICD-10-CM | POA: Diagnosis not present

## 2018-12-07 DIAGNOSIS — I10 Essential (primary) hypertension: Secondary | ICD-10-CM | POA: Diagnosis not present

## 2018-12-07 DIAGNOSIS — Z7189 Other specified counseling: Secondary | ICD-10-CM | POA: Diagnosis not present

## 2018-12-07 DIAGNOSIS — E1142 Type 2 diabetes mellitus with diabetic polyneuropathy: Secondary | ICD-10-CM | POA: Diagnosis not present

## 2019-06-07 DIAGNOSIS — E1100 Type 2 diabetes mellitus with hyperosmolarity without nonketotic hyperglycemic-hyperosmolar coma (NKHHC): Secondary | ICD-10-CM | POA: Diagnosis not present

## 2019-06-07 DIAGNOSIS — E119 Type 2 diabetes mellitus without complications: Secondary | ICD-10-CM | POA: Diagnosis not present

## 2019-06-07 DIAGNOSIS — N390 Urinary tract infection, site not specified: Secondary | ICD-10-CM | POA: Diagnosis not present

## 2019-06-07 DIAGNOSIS — E785 Hyperlipidemia, unspecified: Secondary | ICD-10-CM | POA: Diagnosis not present

## 2019-06-07 DIAGNOSIS — E118 Type 2 diabetes mellitus with unspecified complications: Secondary | ICD-10-CM | POA: Diagnosis not present

## 2019-06-07 DIAGNOSIS — E1169 Type 2 diabetes mellitus with other specified complication: Secondary | ICD-10-CM | POA: Diagnosis not present

## 2019-06-07 DIAGNOSIS — E1142 Type 2 diabetes mellitus with diabetic polyneuropathy: Secondary | ICD-10-CM | POA: Diagnosis not present

## 2019-06-07 DIAGNOSIS — Z0000 Encounter for general adult medical examination without abnormal findings: Secondary | ICD-10-CM | POA: Diagnosis not present

## 2019-09-09 DIAGNOSIS — E11649 Type 2 diabetes mellitus with hypoglycemia without coma: Secondary | ICD-10-CM | POA: Diagnosis not present

## 2019-09-09 DIAGNOSIS — M1990 Unspecified osteoarthritis, unspecified site: Secondary | ICD-10-CM | POA: Diagnosis not present

## 2019-09-09 DIAGNOSIS — I10 Essential (primary) hypertension: Secondary | ICD-10-CM | POA: Diagnosis not present

## 2019-09-09 DIAGNOSIS — E785 Hyperlipidemia, unspecified: Secondary | ICD-10-CM | POA: Diagnosis not present

## 2019-10-11 DIAGNOSIS — E1169 Type 2 diabetes mellitus with other specified complication: Secondary | ICD-10-CM | POA: Diagnosis not present

## 2019-10-11 DIAGNOSIS — I10 Essential (primary) hypertension: Secondary | ICD-10-CM | POA: Diagnosis not present

## 2019-10-28 DIAGNOSIS — H905 Unspecified sensorineural hearing loss: Secondary | ICD-10-CM | POA: Diagnosis not present

## 2019-11-21 DIAGNOSIS — I10 Essential (primary) hypertension: Secondary | ICD-10-CM | POA: Diagnosis not present

## 2019-11-21 DIAGNOSIS — E1169 Type 2 diabetes mellitus with other specified complication: Secondary | ICD-10-CM | POA: Diagnosis not present

## 2020-01-10 DIAGNOSIS — I10 Essential (primary) hypertension: Secondary | ICD-10-CM | POA: Diagnosis not present

## 2020-01-10 DIAGNOSIS — E11649 Type 2 diabetes mellitus with hypoglycemia without coma: Secondary | ICD-10-CM | POA: Diagnosis not present

## 2020-01-10 DIAGNOSIS — E785 Hyperlipidemia, unspecified: Secondary | ICD-10-CM | POA: Diagnosis not present

## 2020-01-10 DIAGNOSIS — M1990 Unspecified osteoarthritis, unspecified site: Secondary | ICD-10-CM | POA: Diagnosis not present

## 2020-02-27 DIAGNOSIS — I10 Essential (primary) hypertension: Secondary | ICD-10-CM | POA: Diagnosis not present

## 2020-02-27 DIAGNOSIS — R001 Bradycardia, unspecified: Secondary | ICD-10-CM | POA: Diagnosis not present

## 2020-02-27 DIAGNOSIS — E1169 Type 2 diabetes mellitus with other specified complication: Secondary | ICD-10-CM | POA: Diagnosis not present

## 2020-02-27 DIAGNOSIS — Z23 Encounter for immunization: Secondary | ICD-10-CM | POA: Diagnosis not present

## 2020-02-28 DIAGNOSIS — Z23 Encounter for immunization: Secondary | ICD-10-CM | POA: Diagnosis not present

## 2020-03-16 DIAGNOSIS — Z23 Encounter for immunization: Secondary | ICD-10-CM | POA: Diagnosis not present

## 2020-04-10 DIAGNOSIS — E785 Hyperlipidemia, unspecified: Secondary | ICD-10-CM | POA: Diagnosis not present

## 2020-04-10 DIAGNOSIS — E11649 Type 2 diabetes mellitus with hypoglycemia without coma: Secondary | ICD-10-CM | POA: Diagnosis not present

## 2020-04-10 DIAGNOSIS — I10 Essential (primary) hypertension: Secondary | ICD-10-CM | POA: Diagnosis not present

## 2020-04-10 DIAGNOSIS — M1990 Unspecified osteoarthritis, unspecified site: Secondary | ICD-10-CM | POA: Diagnosis not present

## 2020-05-26 ENCOUNTER — Other Ambulatory Visit: Payer: Medicare Other

## 2020-05-26 DIAGNOSIS — Z20822 Contact with and (suspected) exposure to covid-19: Secondary | ICD-10-CM

## 2020-05-29 LAB — NOVEL CORONAVIRUS, NAA: SARS-CoV-2, NAA: NOT DETECTED

## 2020-06-26 DIAGNOSIS — N390 Urinary tract infection, site not specified: Secondary | ICD-10-CM | POA: Diagnosis not present

## 2020-06-26 DIAGNOSIS — Z23 Encounter for immunization: Secondary | ICD-10-CM | POA: Diagnosis not present

## 2020-06-26 DIAGNOSIS — E1169 Type 2 diabetes mellitus with other specified complication: Secondary | ICD-10-CM | POA: Diagnosis not present

## 2020-06-26 DIAGNOSIS — I10 Essential (primary) hypertension: Secondary | ICD-10-CM | POA: Diagnosis not present

## 2020-06-26 DIAGNOSIS — E785 Hyperlipidemia, unspecified: Secondary | ICD-10-CM | POA: Diagnosis not present

## 2020-07-09 DIAGNOSIS — E11649 Type 2 diabetes mellitus with hypoglycemia without coma: Secondary | ICD-10-CM | POA: Diagnosis not present

## 2020-07-09 DIAGNOSIS — M1990 Unspecified osteoarthritis, unspecified site: Secondary | ICD-10-CM | POA: Diagnosis not present

## 2020-07-09 DIAGNOSIS — E785 Hyperlipidemia, unspecified: Secondary | ICD-10-CM | POA: Diagnosis not present

## 2020-07-09 DIAGNOSIS — I10 Essential (primary) hypertension: Secondary | ICD-10-CM | POA: Diagnosis not present

## 2020-07-23 ENCOUNTER — Encounter (HOSPITAL_COMMUNITY): Payer: Self-pay | Admitting: *Deleted

## 2020-07-23 ENCOUNTER — Other Ambulatory Visit: Payer: Self-pay

## 2020-07-23 ENCOUNTER — Emergency Department (HOSPITAL_COMMUNITY)
Admission: EM | Admit: 2020-07-23 | Discharge: 2020-07-23 | Disposition: A | Discharge status: Home / Self Care | Payer: Medicare Other | Attending: Emergency Medicine | Admitting: Emergency Medicine

## 2020-07-23 ENCOUNTER — Emergency Department (HOSPITAL_COMMUNITY): Payer: Medicare Other

## 2020-07-23 DIAGNOSIS — Z87891 Personal history of nicotine dependence: Secondary | ICD-10-CM | POA: Diagnosis not present

## 2020-07-23 DIAGNOSIS — R109 Unspecified abdominal pain: Secondary | ICD-10-CM

## 2020-07-23 DIAGNOSIS — E119 Type 2 diabetes mellitus without complications: Secondary | ICD-10-CM | POA: Diagnosis not present

## 2020-07-23 DIAGNOSIS — Z7982 Long term (current) use of aspirin: Secondary | ICD-10-CM | POA: Diagnosis not present

## 2020-07-23 DIAGNOSIS — R1010 Upper abdominal pain, unspecified: Secondary | ICD-10-CM | POA: Insufficient documentation

## 2020-07-23 DIAGNOSIS — I10 Essential (primary) hypertension: Secondary | ICD-10-CM | POA: Insufficient documentation

## 2020-07-23 DIAGNOSIS — Z961 Presence of intraocular lens: Secondary | ICD-10-CM | POA: Diagnosis not present

## 2020-07-23 DIAGNOSIS — Z8543 Personal history of malignant neoplasm of ovary: Secondary | ICD-10-CM | POA: Diagnosis not present

## 2020-07-23 DIAGNOSIS — Z79899 Other long term (current) drug therapy: Secondary | ICD-10-CM | POA: Insufficient documentation

## 2020-07-23 DIAGNOSIS — K5900 Constipation, unspecified: Secondary | ICD-10-CM

## 2020-07-23 NOTE — ED Triage Notes (Signed)
Pt with constipation.  LBM yesterday per pt. Pt states unable to go today.

## 2020-07-23 NOTE — ED Provider Notes (Signed)
[facility] Provider Note   CSN: [id number] Arrival date & time: 07/23/20  1859     History Chief Complaint  Patient presents with  . Constipation    Kiara Martinez is a 87 y.o. female.  HPI Patient presents with constipation.  States that last bowel movement yesterday but she had to take some laxatives to make it happen.  States for the last couple days she has been feeling as if she has had a bowel movement.  States she is on some arthritis pain medicine that has codeine in it.  States he has been taking that more the last few days 2.  Feels as if she has to have a bowel movement but cannot.  No nausea or vomiting.  Abdomen is not feel more distended but she states it does feel little full in the upper abdomen.    Past Medical History:  Diagnosis Date  . Anxiety   . Cancer ([facility])    ovarian  . Diabetes mellitus   . GERD (gastroesophageal reflux disease)   . Hypercholesteremia   . Hypertension     Patient Active Problem List   Diagnosis Date Noted  . IMPINGEMENT SYNDROME 03/13/2010    Past Surgical History:  Procedure Laterality Date  . ABDOMINAL HYSTERECTOMY    . CATARACT EXTRACTION W/PHACO Left 11/06/2015   Procedure: CATARACT EXTRACTION PHACO AND INTRAOCULAR LENS PLACEMENT (IOC);  Surgeon: Rutherford Guys, MD;  Location: [facility];  Service: Ophthalmology;  Laterality: Left;  CDE: 14.60  . CATARACT EXTRACTION W/PHACO Right 11/20/2015   Procedure: CATARACT EXTRACTION PHACO AND INTRAOCULAR LENS PLACEMENT (IOC);  Surgeon: Rutherford Guys, MD;  Location: [facility];  Service: Ophthalmology;  Laterality: Right;  CDE: 7.12  . surgery for ovarian cancer       OB History   No obstetric history on file.     History reviewed. No pertinent family history.  Social History   Tobacco Use  . Smoking status: Former Smoker    Packs/day: 0.50    Years: 35.00    Pack years: 17.50    Types: Cigarettes  . Smokeless tobacco: Never Used  Substance Use Topics  .  Alcohol use: No  . Drug use: No    Home Medications Prior to Admission medications   Medication Sig Start Date End Date Taking? Authorizing Provider  acetaminophen (TYLENOL) 650 MG CR tablet Take 650 mg by mouth every 8 (eight) hours as needed for pain.   Yes [provider]  acetaminophen-codeine (TYLENOL #3) 300-30 MG tablet Take 1 tablet by mouth daily as needed. 06/26/20  Yes [provider]  amLODipine-benazepril (LOTREL) 5-10 MG per capsule Take 1 capsule by mouth daily.   Yes [provider]  aspirin EC 81 MG tablet Take 81 mg by mouth daily.   Yes [provider]  donepezil (ARICEPT) 5 MG tablet Take 5 mg by mouth at bedtime. 05/23/20  Yes [provider]  gabapentin (NEURONTIN) 100 MG capsule Take 100 mg by mouth 3 (three) times daily. 07/23/20  Yes [provider]  hydrochlorothiazide (HYDRODIURIL) 25 MG tablet Take 25 mg by mouth daily.    Yes [provider]  omeprazole (PRILOSEC) 20 MG capsule Take 20 mg by mouth daily.   Yes [provider]  Choline Fenofibrate (FENOFIBRIC ACID) 45 MG CPDR Take 45 mg by mouth daily. Patient not taking: Reported on 07/23/2020    [provider]  cloNIDine (CATAPRES) 0.1 MG tablet Take 0.1 mg by mouth daily as  needed (If blood pressure gets over 200). Patient not taking: No sig reported    [provider]    Allergies    Patient has no known allergies.  Review of Systems   Review of Systems  Constitutional: Negative for appetite change.  HENT: Negative for congestion.   Respiratory: Negative for shortness of breath.   Gastrointestinal: Positive for abdominal pain and constipation. Negative for nausea and rectal pain.  Genitourinary: Negative for flank pain.  Musculoskeletal: Negative for back pain.  Skin: Negative for rash.  Neurological: Negative for weakness.  Psychiatric/Behavioral: Negative for confusion.    Physical Exam Updated Vital Signs BP  135/72   Pulse 65   Temp 98.5 F (36.9 C) (Oral)   Resp 14   Ht 5\' 5"  (1.651 m)   Wt 74.4 kg   SpO2 97%   BMI 27.29 kg/m   Physical Exam Vitals and nursing note reviewed.  Constitutional:      Appearance: Normal appearance.  HENT:     Head: Atraumatic.     Mouth/Throat:     Mouth: Mucous membranes are moist.  Eyes:     Pupils: Pupils are equal, round, and reactive to light.  Cardiovascular:     Rate and Rhythm: Regular rhythm.  Pulmonary:     Breath sounds: No wheezing.  Abdominal:     Comments: Mild upper abdominal tenderness without rebound or guarding.  No hernia palpated.  Musculoskeletal:     Cervical back: Neck supple.     Right lower leg: No edema.     Left lower leg: No edema.  Skin:    General: Skin is warm.     Capillary Refill: Capillary refill takes less than 2 seconds.  Neurological:     Mental Status: She is alert and oriented to person, place, and time.  Psychiatric:        Mood and Affect: Mood normal.     ED Results / Procedures / Treatments   Labs (all labs ordered are listed, but only abnormal results are displayed) Labs Reviewed - No data to display  EKG None  Radiology DG Abd 2 Views  Result Date: 07/23/2020 CLINICAL DATA:  Diffuse abdominal pain for the past 2 weeks. History of ovarian cancer. EXAM: ABDOMEN - 2 VIEW COMPARISON:  None. FINDINGS: Prominent, nondilated air-filled small bowel and colon. Air and stool in the rectum. There is no evidence of free air. No radio-opaque calculi or other significant radiographic abnormality is seen. Low lung volumes. No acute osseous abnormality. IMPRESSION: 1. Prominent bowel gas without obstruction or ileus. Electronically Signed   By: Titus Dubin M.D.   On: 07/23/2020 20:38    Procedures Procedures   Medications Ordered in ED Medications - No data to display  ED Course  I have reviewed the triage vital signs and the nursing notes.  Pertinent labs & imaging results that were available  during my care of the patient were reviewed by me and considered in my medical decision making (see chart for details).    MDM Rules/Calculators/A&P                          Patient with abdominal pain.  Dull.  No bowel movements yesterday.  Rectal exam did not show stool.  X-ray showed some mild gaseous distention without obstruction.  Has not been vomiting.  Rather benign abdominal exam.  Doubt severe obstruction at this time.  Doubt severe constipation 2.  Discussed with  patient and fields medication such as Gas-X may be more beneficial.  Discharge home and follow-up instructions and return precautions given. Final Clinical Impression(s) / ED Diagnoses Final diagnoses:  Constipation  Abdominal pain, unspecified abdominal location    Rx / DC Orders ED Discharge Orders    None       Davonna Belling, MD 07/23/20 2332

## 2020-07-23 NOTE — ED Notes (Signed)
Assisted Dr. Alvino Chapel with rectal exam.

## 2020-07-23 NOTE — Discharge Instructions (Addendum)
Your x-ray did not show too much stool.  There was a lot of gas however.  Medicine such as Gas-X may help.  Follow-up with your doctor as needed. return for worsening pain or increased swelling in your abdomen.

## 2020-08-15 DIAGNOSIS — H52203 Unspecified astigmatism, bilateral: Secondary | ICD-10-CM | POA: Diagnosis not present

## 2020-08-15 DIAGNOSIS — H524 Presbyopia: Secondary | ICD-10-CM | POA: Diagnosis not present

## 2020-11-08 DIAGNOSIS — I10 Essential (primary) hypertension: Secondary | ICD-10-CM | POA: Diagnosis not present

## 2020-11-08 DIAGNOSIS — M1990 Unspecified osteoarthritis, unspecified site: Secondary | ICD-10-CM | POA: Diagnosis not present

## 2020-11-08 DIAGNOSIS — E11649 Type 2 diabetes mellitus with hypoglycemia without coma: Secondary | ICD-10-CM | POA: Diagnosis not present

## 2020-11-08 DIAGNOSIS — E785 Hyperlipidemia, unspecified: Secondary | ICD-10-CM | POA: Diagnosis not present

## 2020-12-03 DIAGNOSIS — Z794 Long term (current) use of insulin: Secondary | ICD-10-CM | POA: Diagnosis not present

## 2020-12-03 DIAGNOSIS — E785 Hyperlipidemia, unspecified: Secondary | ICD-10-CM | POA: Diagnosis not present

## 2020-12-03 DIAGNOSIS — M150 Primary generalized (osteo)arthritis: Secondary | ICD-10-CM | POA: Diagnosis not present

## 2020-12-03 DIAGNOSIS — E1142 Type 2 diabetes mellitus with diabetic polyneuropathy: Secondary | ICD-10-CM | POA: Diagnosis not present

## 2020-12-03 DIAGNOSIS — I10 Essential (primary) hypertension: Secondary | ICD-10-CM | POA: Diagnosis not present

## 2020-12-03 DIAGNOSIS — E782 Mixed hyperlipidemia: Secondary | ICD-10-CM | POA: Diagnosis not present

## 2020-12-03 DIAGNOSIS — E1169 Type 2 diabetes mellitus with other specified complication: Secondary | ICD-10-CM | POA: Diagnosis not present

## 2021-01-09 DIAGNOSIS — M1990 Unspecified osteoarthritis, unspecified site: Secondary | ICD-10-CM | POA: Diagnosis not present

## 2021-01-09 DIAGNOSIS — E785 Hyperlipidemia, unspecified: Secondary | ICD-10-CM | POA: Diagnosis not present

## 2021-01-09 DIAGNOSIS — E11649 Type 2 diabetes mellitus with hypoglycemia without coma: Secondary | ICD-10-CM | POA: Diagnosis not present

## 2021-01-09 DIAGNOSIS — I10 Essential (primary) hypertension: Secondary | ICD-10-CM | POA: Diagnosis not present

## 2021-02-08 DIAGNOSIS — E785 Hyperlipidemia, unspecified: Secondary | ICD-10-CM | POA: Diagnosis not present

## 2021-02-08 DIAGNOSIS — M1990 Unspecified osteoarthritis, unspecified site: Secondary | ICD-10-CM | POA: Diagnosis not present

## 2021-02-08 DIAGNOSIS — I10 Essential (primary) hypertension: Secondary | ICD-10-CM | POA: Diagnosis not present

## 2021-02-08 DIAGNOSIS — E11649 Type 2 diabetes mellitus with hypoglycemia without coma: Secondary | ICD-10-CM | POA: Diagnosis not present

## 2021-03-06 DIAGNOSIS — H905 Unspecified sensorineural hearing loss: Secondary | ICD-10-CM | POA: Diagnosis not present

## 2021-03-11 DIAGNOSIS — E785 Hyperlipidemia, unspecified: Secondary | ICD-10-CM | POA: Diagnosis not present

## 2021-03-11 DIAGNOSIS — I10 Essential (primary) hypertension: Secondary | ICD-10-CM | POA: Diagnosis not present

## 2021-03-11 DIAGNOSIS — M1990 Unspecified osteoarthritis, unspecified site: Secondary | ICD-10-CM | POA: Diagnosis not present

## 2021-03-18 DIAGNOSIS — E785 Hyperlipidemia, unspecified: Secondary | ICD-10-CM | POA: Diagnosis not present

## 2021-03-18 DIAGNOSIS — I10 Essential (primary) hypertension: Secondary | ICD-10-CM | POA: Diagnosis not present

## 2021-03-18 DIAGNOSIS — E1169 Type 2 diabetes mellitus with other specified complication: Secondary | ICD-10-CM | POA: Diagnosis not present

## 2021-03-18 DIAGNOSIS — Z23 Encounter for immunization: Secondary | ICD-10-CM | POA: Diagnosis not present

## 2021-04-10 DIAGNOSIS — M1990 Unspecified osteoarthritis, unspecified site: Secondary | ICD-10-CM | POA: Diagnosis not present

## 2021-04-10 DIAGNOSIS — I10 Essential (primary) hypertension: Secondary | ICD-10-CM | POA: Diagnosis not present

## 2021-04-10 DIAGNOSIS — E785 Hyperlipidemia, unspecified: Secondary | ICD-10-CM | POA: Diagnosis not present

## 2021-06-09 DIAGNOSIS — M1990 Unspecified osteoarthritis, unspecified site: Secondary | ICD-10-CM | POA: Diagnosis not present

## 2021-06-09 DIAGNOSIS — I10 Essential (primary) hypertension: Secondary | ICD-10-CM | POA: Diagnosis not present

## 2021-06-09 DIAGNOSIS — E785 Hyperlipidemia, unspecified: Secondary | ICD-10-CM | POA: Diagnosis not present

## 2021-09-16 DIAGNOSIS — Z0000 Encounter for general adult medical examination without abnormal findings: Secondary | ICD-10-CM | POA: Diagnosis not present

## 2021-09-16 DIAGNOSIS — E1169 Type 2 diabetes mellitus with other specified complication: Secondary | ICD-10-CM | POA: Diagnosis not present

## 2021-09-16 DIAGNOSIS — E785 Hyperlipidemia, unspecified: Secondary | ICD-10-CM | POA: Diagnosis not present

## 2021-09-16 DIAGNOSIS — I10 Essential (primary) hypertension: Secondary | ICD-10-CM | POA: Diagnosis not present

## 2021-10-01 DIAGNOSIS — Z961 Presence of intraocular lens: Secondary | ICD-10-CM | POA: Diagnosis not present

## 2021-10-01 DIAGNOSIS — E119 Type 2 diabetes mellitus without complications: Secondary | ICD-10-CM | POA: Diagnosis not present

## 2021-10-01 DIAGNOSIS — H5213 Myopia, bilateral: Secondary | ICD-10-CM | POA: Diagnosis not present

## 2021-11-14 DIAGNOSIS — H52223 Regular astigmatism, bilateral: Secondary | ICD-10-CM | POA: Diagnosis not present

## 2021-11-14 DIAGNOSIS — H524 Presbyopia: Secondary | ICD-10-CM | POA: Diagnosis not present

## 2021-11-18 DIAGNOSIS — K625 Hemorrhage of anus and rectum: Secondary | ICD-10-CM | POA: Diagnosis not present

## 2021-11-18 DIAGNOSIS — Z794 Long term (current) use of insulin: Secondary | ICD-10-CM | POA: Diagnosis not present

## 2021-11-18 DIAGNOSIS — E1169 Type 2 diabetes mellitus with other specified complication: Secondary | ICD-10-CM | POA: Diagnosis not present

## 2021-11-18 DIAGNOSIS — E1165 Type 2 diabetes mellitus with hyperglycemia: Secondary | ICD-10-CM | POA: Diagnosis not present

## 2021-12-28 IMAGING — DX DG ABDOMEN 2V
3 series · 3 of 3 positions shown · non-contrast
Comparison: None.

CLINICAL DATA: Diffuse abdominal pain for the past 2 weeks. History
of ovarian cancer.

EXAM:
ABDOMEN - 2 VIEW

[abdomen erect]
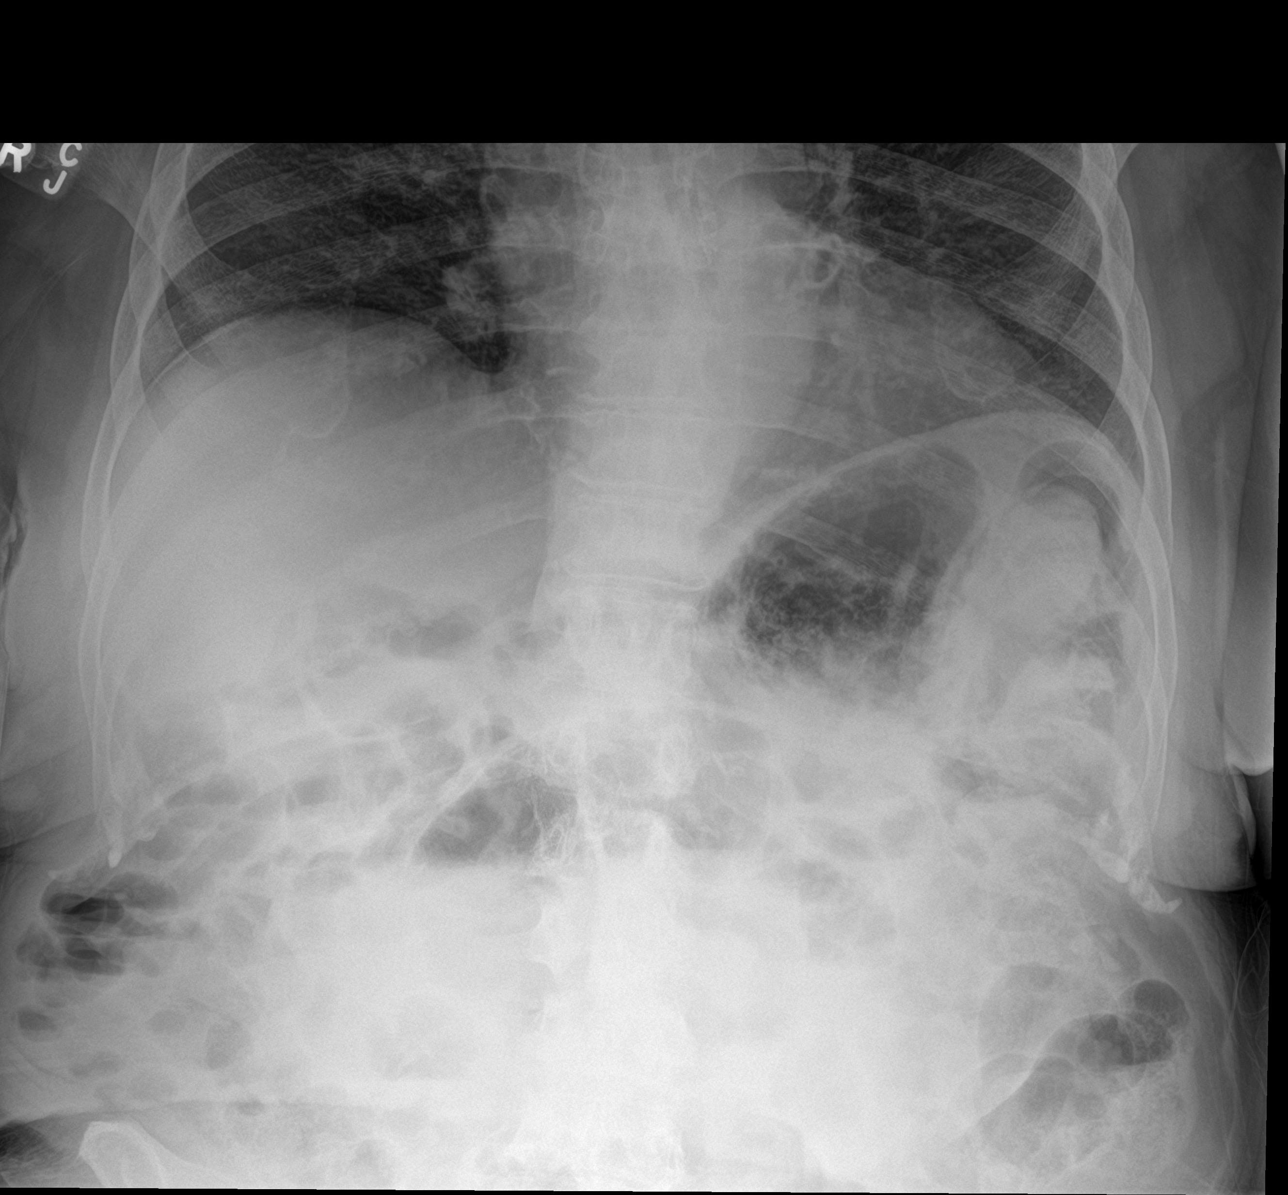

[abdomen supine (1 of 2)]
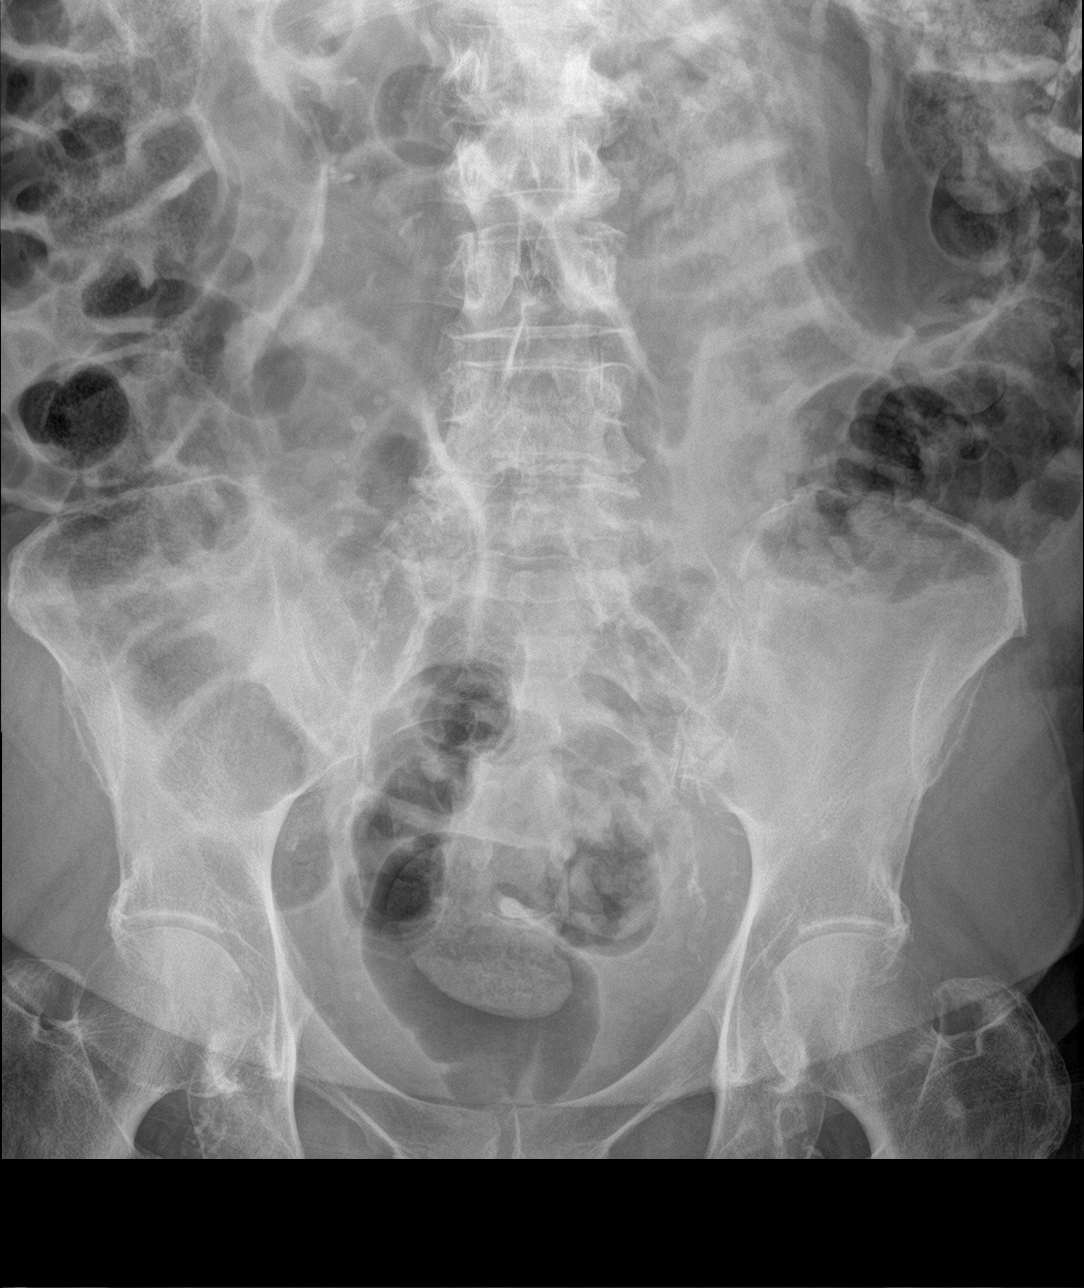

[abdomen supine (2 of 2)]
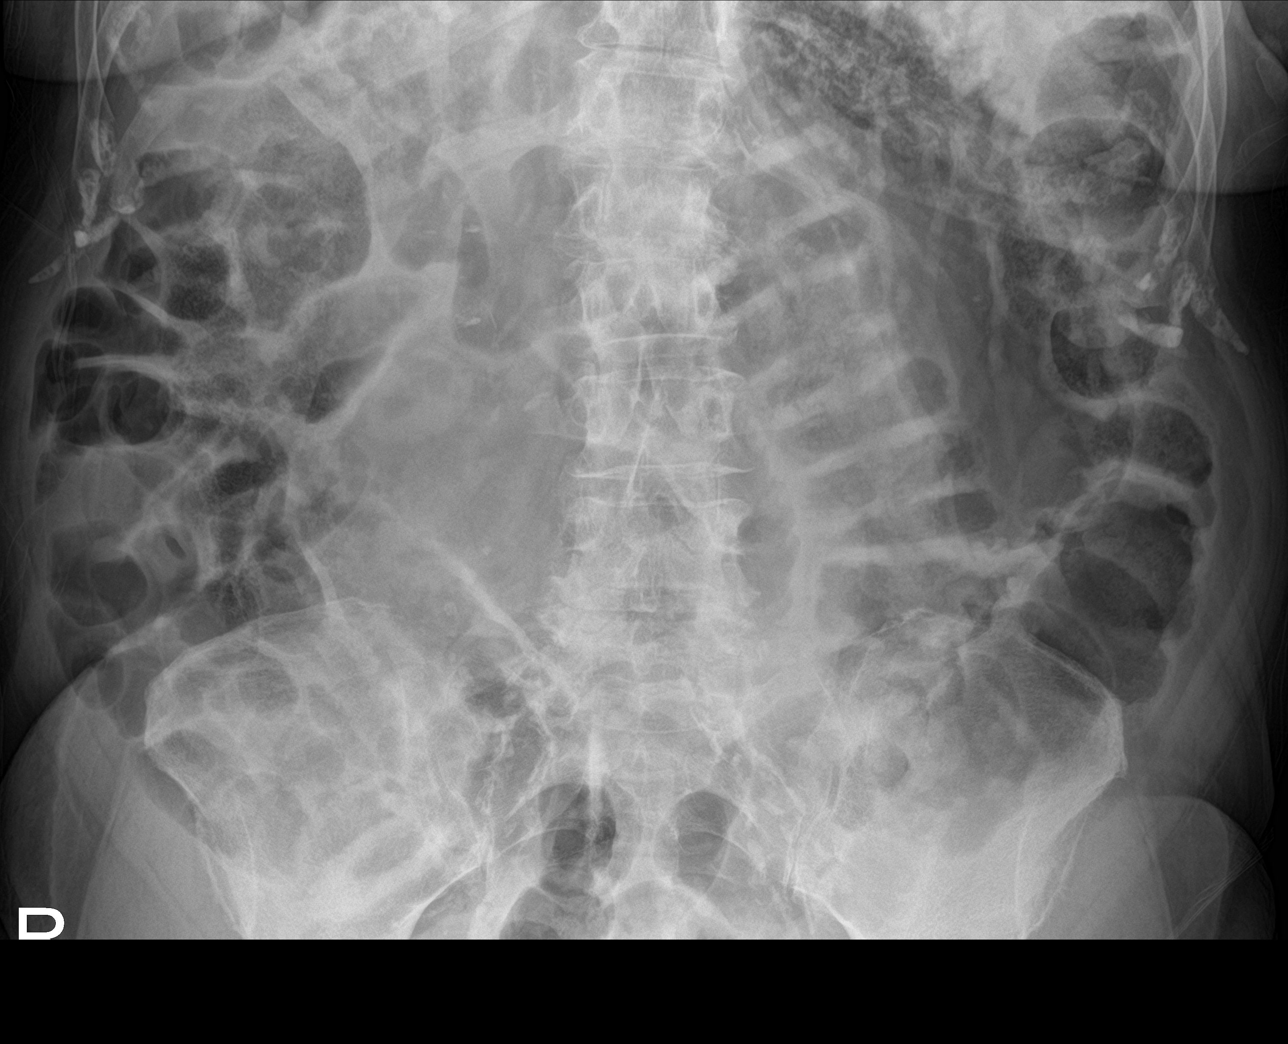

[3 of 3 positions shown; findings below may reference images not displayed]

FINDINGS: Prominent, nondilated air-filled small bowel and colon. Air and
stool in the rectum. There is no evidence of free air. No
radio-opaque calculi or other significant radiographic abnormality
is seen. Low lung volumes. No acute osseous abnormality.
IMPRESSION: 1. Prominent bowel gas without obstruction or ileus.

## 2022-03-04 DIAGNOSIS — Z23 Encounter for immunization: Secondary | ICD-10-CM | POA: Diagnosis not present

## 2022-03-04 DIAGNOSIS — E785 Hyperlipidemia, unspecified: Secondary | ICD-10-CM | POA: Diagnosis not present

## 2022-03-04 DIAGNOSIS — E1169 Type 2 diabetes mellitus with other specified complication: Secondary | ICD-10-CM | POA: Diagnosis not present

## 2022-03-04 DIAGNOSIS — I10 Essential (primary) hypertension: Secondary | ICD-10-CM | POA: Diagnosis not present

## 2022-03-04 DIAGNOSIS — M150 Primary generalized (osteo)arthritis: Secondary | ICD-10-CM | POA: Diagnosis not present

## 2022-08-12 DIAGNOSIS — E785 Hyperlipidemia, unspecified: Secondary | ICD-10-CM | POA: Diagnosis not present

## 2022-08-12 DIAGNOSIS — M150 Primary generalized (osteo)arthritis: Secondary | ICD-10-CM | POA: Diagnosis not present

## 2022-08-12 DIAGNOSIS — E1169 Type 2 diabetes mellitus with other specified complication: Secondary | ICD-10-CM | POA: Diagnosis not present

## 2022-08-12 DIAGNOSIS — E7800 Pure hypercholesterolemia, unspecified: Secondary | ICD-10-CM | POA: Diagnosis not present

## 2022-08-12 DIAGNOSIS — I10 Essential (primary) hypertension: Secondary | ICD-10-CM | POA: Diagnosis not present

## 2022-08-22 DIAGNOSIS — E876 Hypokalemia: Secondary | ICD-10-CM | POA: Diagnosis not present

## 2022-08-25 ENCOUNTER — Emergency Department (HOSPITAL_COMMUNITY)
Admission: EM | Admit: 2022-08-25 | Discharge: 2022-08-25 | Disposition: A | Discharge status: Home / Self Care | Payer: 59 | Source: Home / Self Care

## 2022-11-11 DIAGNOSIS — I10 Essential (primary) hypertension: Secondary | ICD-10-CM | POA: Diagnosis not present

## 2022-11-11 DIAGNOSIS — N390 Urinary tract infection, site not specified: Secondary | ICD-10-CM | POA: Diagnosis not present

## 2022-11-11 DIAGNOSIS — N1831 Chronic kidney disease, stage 3a: Secondary | ICD-10-CM | POA: Diagnosis not present

## 2022-11-11 DIAGNOSIS — M150 Primary generalized (osteo)arthritis: Secondary | ICD-10-CM | POA: Diagnosis not present

## 2022-11-11 DIAGNOSIS — E785 Hyperlipidemia, unspecified: Secondary | ICD-10-CM | POA: Diagnosis not present

## 2022-11-11 DIAGNOSIS — E1169 Type 2 diabetes mellitus with other specified complication: Secondary | ICD-10-CM | POA: Diagnosis not present

## 2023-01-19 DIAGNOSIS — H1045 Other chronic allergic conjunctivitis: Secondary | ICD-10-CM | POA: Diagnosis not present

## 2023-01-19 DIAGNOSIS — Z961 Presence of intraocular lens: Secondary | ICD-10-CM | POA: Diagnosis not present

## 2023-01-19 DIAGNOSIS — E109 Type 1 diabetes mellitus without complications: Secondary | ICD-10-CM | POA: Diagnosis not present

## 2023-03-03 DIAGNOSIS — I129 Hypertensive chronic kidney disease with stage 1 through stage 4 chronic kidney disease, or unspecified chronic kidney disease: Secondary | ICD-10-CM | POA: Diagnosis not present

## 2023-03-03 DIAGNOSIS — J301 Allergic rhinitis due to pollen: Secondary | ICD-10-CM | POA: Diagnosis not present

## 2023-03-03 DIAGNOSIS — Z23 Encounter for immunization: Secondary | ICD-10-CM | POA: Diagnosis not present

## 2023-03-03 DIAGNOSIS — R001 Bradycardia, unspecified: Secondary | ICD-10-CM | POA: Diagnosis not present

## 2023-03-03 DIAGNOSIS — E1169 Type 2 diabetes mellitus with other specified complication: Secondary | ICD-10-CM | POA: Diagnosis not present

## 2023-03-03 DIAGNOSIS — N1831 Chronic kidney disease, stage 3a: Secondary | ICD-10-CM | POA: Diagnosis not present

## 2023-08-03 DIAGNOSIS — I129 Hypertensive chronic kidney disease with stage 1 through stage 4 chronic kidney disease, or unspecified chronic kidney disease: Secondary | ICD-10-CM | POA: Diagnosis not present

## 2023-08-03 DIAGNOSIS — R001 Bradycardia, unspecified: Secondary | ICD-10-CM | POA: Diagnosis not present

## 2023-08-03 DIAGNOSIS — I10 Essential (primary) hypertension: Secondary | ICD-10-CM | POA: Diagnosis not present

## 2023-08-03 DIAGNOSIS — E7800 Pure hypercholesterolemia, unspecified: Secondary | ICD-10-CM | POA: Diagnosis not present

## 2023-08-03 DIAGNOSIS — N1831 Chronic kidney disease, stage 3a: Secondary | ICD-10-CM | POA: Diagnosis not present

## 2023-08-03 DIAGNOSIS — M1831 Unilateral post-traumatic osteoarthritis of first carpometacarpal joint, right hand: Secondary | ICD-10-CM | POA: Diagnosis not present

## 2023-08-03 DIAGNOSIS — E1169 Type 2 diabetes mellitus with other specified complication: Secondary | ICD-10-CM | POA: Diagnosis not present

## 2023-11-10 DIAGNOSIS — E1122 Type 2 diabetes mellitus with diabetic chronic kidney disease: Secondary | ICD-10-CM | POA: Diagnosis not present

## 2023-11-10 DIAGNOSIS — M79672 Pain in left foot: Secondary | ICD-10-CM | POA: Diagnosis not present

## 2023-11-10 DIAGNOSIS — M1831 Unilateral post-traumatic osteoarthritis of first carpometacarpal joint, right hand: Secondary | ICD-10-CM | POA: Diagnosis not present

## 2023-11-10 DIAGNOSIS — E1169 Type 2 diabetes mellitus with other specified complication: Secondary | ICD-10-CM | POA: Diagnosis not present

## 2023-11-10 DIAGNOSIS — N390 Urinary tract infection, site not specified: Secondary | ICD-10-CM | POA: Diagnosis not present

## 2023-11-10 DIAGNOSIS — R001 Bradycardia, unspecified: Secondary | ICD-10-CM | POA: Diagnosis not present

## 2023-11-10 DIAGNOSIS — N1832 Chronic kidney disease, stage 3b: Secondary | ICD-10-CM | POA: Diagnosis not present

## 2023-11-10 DIAGNOSIS — Z794 Long term (current) use of insulin: Secondary | ICD-10-CM | POA: Diagnosis not present

## 2023-11-10 DIAGNOSIS — I129 Hypertensive chronic kidney disease with stage 1 through stage 4 chronic kidney disease, or unspecified chronic kidney disease: Secondary | ICD-10-CM | POA: Diagnosis not present

## 2023-11-10 DIAGNOSIS — W108XXA Fall (on) (from) other stairs and steps, initial encounter: Secondary | ICD-10-CM | POA: Diagnosis not present

## 2023-11-10 DIAGNOSIS — M79671 Pain in right foot: Secondary | ICD-10-CM | POA: Diagnosis not present

## 2024-02-09 DIAGNOSIS — R001 Bradycardia, unspecified: Secondary | ICD-10-CM | POA: Diagnosis not present

## 2024-02-09 DIAGNOSIS — I10 Essential (primary) hypertension: Secondary | ICD-10-CM | POA: Diagnosis not present

## 2024-02-09 DIAGNOSIS — N390 Urinary tract infection, site not specified: Secondary | ICD-10-CM | POA: Diagnosis not present

## 2024-02-09 DIAGNOSIS — N1832 Chronic kidney disease, stage 3b: Secondary | ICD-10-CM | POA: Diagnosis not present

## 2024-02-09 DIAGNOSIS — M150 Primary generalized (osteo)arthritis: Secondary | ICD-10-CM | POA: Diagnosis not present

## 2024-02-09 DIAGNOSIS — E1122 Type 2 diabetes mellitus with diabetic chronic kidney disease: Secondary | ICD-10-CM | POA: Diagnosis not present

## 2024-02-09 DIAGNOSIS — Z23 Encounter for immunization: Secondary | ICD-10-CM | POA: Diagnosis not present

## 2024-02-09 DIAGNOSIS — Z1211 Encounter for screening for malignant neoplasm of colon: Secondary | ICD-10-CM | POA: Diagnosis not present

## 2024-02-09 DIAGNOSIS — R634 Abnormal weight loss: Secondary | ICD-10-CM | POA: Diagnosis not present
